# Patient Record
Sex: Female | Born: 1965 | Race: Black or African American | Hispanic: No | Marital: Single | Smoking: Never smoker
Health system: Southern US, Community
[De-identification: ages and names within clinical notes are randomized; demographics above are authoritative.]

## PROBLEM LIST (undated history)

## (undated) DIAGNOSIS — A6000 Herpesviral infection of urogenital system, unspecified: Secondary | ICD-10-CM

## (undated) DIAGNOSIS — I10 Essential (primary) hypertension: Secondary | ICD-10-CM

## (undated) DIAGNOSIS — F419 Anxiety disorder, unspecified: Secondary | ICD-10-CM

## (undated) DIAGNOSIS — E785 Hyperlipidemia, unspecified: Secondary | ICD-10-CM

## (undated) DIAGNOSIS — F411 Generalized anxiety disorder: Secondary | ICD-10-CM

## (undated) DIAGNOSIS — E669 Obesity, unspecified: Secondary | ICD-10-CM

## (undated) DIAGNOSIS — N189 Chronic kidney disease, unspecified: Secondary | ICD-10-CM

## (undated) DIAGNOSIS — E66811 Obesity, class 1: Secondary | ICD-10-CM

## (undated) DIAGNOSIS — N289 Disorder of kidney and ureter, unspecified: Secondary | ICD-10-CM

## (undated) HISTORY — DX: Obesity, class 1: E66.811

## (undated) HISTORY — DX: Herpesviral infection of urogenital system, unspecified: A60.00

## (undated) HISTORY — DX: Generalized anxiety disorder: F41.1

## (undated) HISTORY — DX: Obesity, unspecified: E66.9

## (undated) HISTORY — DX: Hyperlipidemia, unspecified: E78.5

## (undated) HISTORY — DX: Essential (primary) hypertension: I10

## (undated) HISTORY — DX: Chronic kidney disease, unspecified: N18.9

---

## 2020-09-22 DIAGNOSIS — F419 Anxiety disorder, unspecified: Secondary | ICD-10-CM | POA: Diagnosis not present

## 2020-09-28 DIAGNOSIS — F419 Anxiety disorder, unspecified: Secondary | ICD-10-CM | POA: Diagnosis not present

## 2020-10-13 DIAGNOSIS — F419 Anxiety disorder, unspecified: Secondary | ICD-10-CM | POA: Diagnosis not present

## 2020-10-27 DIAGNOSIS — F419 Anxiety disorder, unspecified: Secondary | ICD-10-CM | POA: Diagnosis not present

## 2020-11-10 DIAGNOSIS — F419 Anxiety disorder, unspecified: Secondary | ICD-10-CM | POA: Diagnosis not present

## 2020-12-07 ENCOUNTER — Ambulatory Visit: Payer: Self-pay | Admitting: Adult Health

## 2021-01-09 DIAGNOSIS — A6000 Herpesviral infection of urogenital system, unspecified: Secondary | ICD-10-CM | POA: Diagnosis not present

## 2021-01-31 DIAGNOSIS — D2239 Melanocytic nevi of other parts of face: Secondary | ICD-10-CM | POA: Diagnosis not present

## 2021-01-31 DIAGNOSIS — L918 Other hypertrophic disorders of the skin: Secondary | ICD-10-CM | POA: Diagnosis not present

## 2021-01-31 DIAGNOSIS — L821 Other seborrheic keratosis: Secondary | ICD-10-CM | POA: Diagnosis not present

## 2021-01-31 DIAGNOSIS — L820 Inflamed seborrheic keratosis: Secondary | ICD-10-CM | POA: Diagnosis not present

## 2021-03-27 ENCOUNTER — Other Ambulatory Visit: Payer: Self-pay | Admitting: Physician Assistant

## 2021-07-03 ENCOUNTER — Encounter: Payer: Self-pay | Admitting: Internal Medicine

## 2021-07-03 ENCOUNTER — Ambulatory Visit: Payer: BC Managed Care – PPO | Admitting: Internal Medicine

## 2021-07-03 VITALS — BP 124/84 | HR 76 | Temp 98.3°F | Ht 68.0 in | Wt 221.7 lb

## 2021-07-03 DIAGNOSIS — F411 Generalized anxiety disorder: Secondary | ICD-10-CM | POA: Diagnosis not present

## 2021-07-03 DIAGNOSIS — Z124 Encounter for screening for malignant neoplasm of cervix: Secondary | ICD-10-CM

## 2021-07-03 DIAGNOSIS — E669 Obesity, unspecified: Secondary | ICD-10-CM | POA: Diagnosis not present

## 2021-07-03 DIAGNOSIS — E785 Hyperlipidemia, unspecified: Secondary | ICD-10-CM | POA: Insufficient documentation

## 2021-07-03 DIAGNOSIS — A6000 Herpesviral infection of urogenital system, unspecified: Secondary | ICD-10-CM

## 2021-07-03 DIAGNOSIS — I10 Essential (primary) hypertension: Secondary | ICD-10-CM

## 2021-07-03 DIAGNOSIS — Z1211 Encounter for screening for malignant neoplasm of colon: Secondary | ICD-10-CM

## 2021-07-03 DIAGNOSIS — E782 Mixed hyperlipidemia: Secondary | ICD-10-CM

## 2021-07-03 DIAGNOSIS — Z1231 Encounter for screening mammogram for malignant neoplasm of breast: Secondary | ICD-10-CM

## 2021-07-03 DIAGNOSIS — E66811 Obesity, class 1: Secondary | ICD-10-CM

## 2021-07-03 MED ORDER — HYDROCHLOROTHIAZIDE 25 MG PO TABS: 25.0000 mg | ORAL_TABLET | Freq: Every day | ORAL | 1 refills | Status: DC

## 2021-07-03 MED ORDER — AMLODIPINE BESYLATE 5 MG PO TABS: 5.0000 mg | ORAL_TABLET | Freq: Every day | ORAL | 1 refills | Status: DC

## 2021-07-03 MED ORDER — VALACYCLOVIR HCL 500 MG PO TABS: 500.0000 mg | ORAL_TABLET | Freq: Every day | ORAL | 1 refills | Status: DC

## 2021-07-03 MED ORDER — OLMESARTAN MEDOXOMIL 20 MG PO TABS: 20.0000 mg | ORAL_TABLET | Freq: Every day | ORAL | 1 refills | Status: DC

## 2021-07-03 MED ORDER — BUPROPION HCL ER (XL) 150 MG PO TB24: 150.0000 mg | ORAL_TABLET | Freq: Every day | ORAL | 1 refills | Status: DC

## 2021-07-03 NOTE — Progress Notes (Signed)
? ? ?New Patient Office Visit ? ? ? ? ?This visit occurred during the SARS-CoV-2 public health emergency.  Safety protocols were in place, including screening questions prior to the visit, additional usage of staff PPE, and extensive cleaning of exam room while observing appropriate contact time as indicated for disinfecting solutions.  ? ? ?CC/Reason for Visit: Establish care, discuss chronic conditions, medication refills ?Previous PCP: In [place] ?Last Visit: 2021 ? ?HPI: Rhonda Harrison is a 56 y.o. female who is coming in today for the above mentioned reasons. Past Medical History is significant for: Hypertension, hyperlipidemia, genital herpes on chronic suppressive therapy, generalized anxiety disorder.  She is an Geophysicist/field seismologist principal for middle school.  She moved from [place] 1 year ago to be closer to family.  She is single, she has 2 daughters and 1 grandson.  She does not smoke.  She drinks alcohol occasionally, no known drug allergies, no past surgical history.  Her parents are both deceased, mother at age 18, however as far as she is aware there is no past medical history of significance in her family.  She is overdue for all age-appropriate cancer screenings.  She will bring in copies of her immunization records but she does not believe she has had an updated Tdap or shingles vaccinations. ? ? ?Past Medical/Surgical History: ?Past Medical History:  ?Diagnosis Date  ? GAD (generalized anxiety disorder)   ? Genital HSV   ? Hyperlipidemia   ? Hypertension   ? Obesity (BMI 30.0-34.9)   ? ? ?History reviewed. No pertinent surgical history. ? ?Social History: ? reports that she has never smoked. She has never used smokeless tobacco. She reports that she does not drink alcohol and does not use drugs. ? ?Allergies: ?No Known Allergies ? ?Family History:  ?History reviewed. No pertinent family history. ? ? ?Current Outpatient Medications:  ?  Ascorbic Acid (VITAMIN C) 100 MG tablet, Take 100 mg by mouth  daily., Disp: , Rfl:  ?  diclofenac Sodium (VOLTAREN) 1 % GEL, Apply 2 g topically 4 (four) times daily., Disp: , Rfl:  ?  Ferrous Gluconate (FE-40 PO), Take by mouth., Disp: , Rfl:  ?  ibuprofen (ADVIL) 600 MG tablet, Take by mouth., Disp: , Rfl:  ?  vitamin B-12 (CYANOCOBALAMIN) 500 MCG tablet, Take 500 mcg by mouth daily., Disp: , Rfl:  ?  amLODipine (NORVASC) 5 MG tablet, Take 1 tablet (5 mg total) by mouth daily., Disp: 90 tablet, Rfl: 1 ?  atorvastatin (LIPITOR) 10 MG tablet, Take 10 mg by mouth daily. (Patient not taking: Reported on 07/03/2021), Disp: , Rfl:  ?  buPROPion (WELLBUTRIN XL) 150 MG 24 hr tablet, Take 1 tablet (150 mg total) by mouth daily., Disp: 90 tablet, Rfl: 1 ?  hydrochlorothiazide (HYDRODIURIL) 25 MG tablet, Take 1 tablet (25 mg total) by mouth daily., Disp: 90 tablet, Rfl: 1 ?  olmesartan (BENICAR) 20 MG tablet, Take 1 tablet (20 mg total) by mouth daily., Disp: 90 tablet, Rfl: 1 ?  valACYclovir (VALTREX) 500 MG tablet, Take 1 tablet (500 mg total) by mouth daily., Disp: 90 tablet, Rfl: 1 ? ?Review of Systems:  ?Constitutional: Denies fever, chills, diaphoresis, appetite change and fatigue.  ?HEENT: Denies photophobia, eye pain, redness, hearing loss, ear pain, congestion, sore throat, rhinorrhea, sneezing, mouth sores, trouble swallowing, neck pain, neck stiffness and tinnitus.   ?Respiratory: Denies SOB, DOE, cough, chest tightness,  and wheezing.   ?Cardiovascular: Denies chest pain, palpitations and leg swelling.  ?Gastrointestinal: Denies  nausea, vomiting, abdominal pain, diarrhea, constipation, blood in stool and abdominal distention.  ?Genitourinary: Denies dysuria, urgency, frequency, hematuria, flank pain and difficulty urinating.  ?Endocrine: Denies: hot or cold intolerance, sweats, changes in hair or nails, polyuria, polydipsia. ?Musculoskeletal: Denies myalgias, back pain, joint swelling, arthralgias and gait problem.  ?Skin: Denies pallor, rash and wound.  ?Neurological:  Denies dizziness, seizures, syncope, weakness, light-headedness, numbness and headaches.  ?Hematological: Denies adenopathy. Easy bruising, personal or family bleeding history  ?Psychiatric/Behavioral: Denies suicidal ideation, mood changes, confusion, nervousness, sleep disturbance and agitation ? ? ? ?Physical Exam: ?Vitals:  ? 07/03/21 1517  ?BP: 124/84  ?Pulse: 76  ?Temp: 98.3 ?F (36.8 ?C)  ?TempSrc: Oral  ?SpO2: 98%  ?Weight: 221 lb 11.2 oz (100.6 kg)  ?Height: 5\' 8"  (1.727 m)  ? ?Body mass index is 33.71 kg/m?. ? ?Constitutional: NAD, calm, comfortable ?Eyes: PERRL, lids and conjunctivae normal ?ENMT: Mucous membranes are moist.  ?Respiratory: clear to auscultation bilaterally, no wheezing, no crackles. Normal respiratory effort. No accessory muscle use.  ?Cardiovascular: Regular rate and rhythm, no murmurs / rubs / gallops. No extremity edema.  ?Neurologic: Grossly intact and nonfocal ?Psychiatric: Normal judgment and insight. Alert and oriented x 3. Normal mood.  ? ? ?Impression and Plan: ? ?Primary hypertension ? - Plan: amLODipine (NORVASC) 5 MG tablet, hydrochlorothiazide (HYDRODIURIL) 25 MG tablet, olmesartan (BENICAR) 20 MG tablet ?-Blood pressure is well controlled on these medications, refills have been sent. ? ?Mixed hyperlipidemia ?-Not on medication, check lipids when she returns for CPE. ? ?GAD (generalized anxiety disorder)  ?- Plan: buPROPion (WELLBUTRIN XL) 150 MG 24 hr tablet ?Flowsheet Row Office Visit from 07/03/2021 in [facility] [place]  ?PHQ-9 Total Score 3  ? ?  ? ?Obesity (BMI 30.0-34.9) ?-Discussed healthy lifestyle, including increased physical activity and better food choices to promote weight loss. ? ?Genital herpes simplex, unspecified site  ?- Plan: valACYclovir (VALTREX) 500 MG tablet ? ?Colon cancer screening  ?- Plan: Ambulatory referral to Gastroenterology ? ?Encounter for screening mammogram for malignant neoplasm of breast  ?- Plan: MM Digital  Screening ? ?Cervical cancer screening ? - Plan: Ambulatory referral to Obstetrics / Gynecology ? ?Time spent: 46 minutes reviewing chart, interviewing and examining patient and formulating plan of care. ? ? ? ?Patient Instructions  ?-Nice seeing you today!! ? ?-Refills have been sent. ? ?-Schedule follow up in 3 months for your physical. Come in fasting. ? ? ? ?07-14-1992, MD ?[facility] ? ?

## 2021-07-03 NOTE — Patient Instructions (Signed)
-  Nice seeing you today!! ? ?-Refills have been sent. ? ?-Schedule follow up in 3 months for your physical. Come in fasting. ?

## 2021-07-04 ENCOUNTER — Encounter: Payer: Self-pay | Admitting: Internal Medicine

## 2021-07-10 ENCOUNTER — Ambulatory Visit
Admission: RE | Admit: 2021-07-10 | Discharge: 2021-07-10 | Disposition: A | Discharge status: Home / Self Care | Payer: BC Managed Care – PPO | Source: Ambulatory Visit | Attending: Internal Medicine | Admitting: Internal Medicine

## 2021-07-10 DIAGNOSIS — Z1231 Encounter for screening mammogram for malignant neoplasm of breast: Secondary | ICD-10-CM

## 2021-08-04 ENCOUNTER — Encounter: Payer: Self-pay | Admitting: Internal Medicine

## 2021-08-04 DIAGNOSIS — G4730 Sleep apnea, unspecified: Secondary | ICD-10-CM

## 2021-08-14 ENCOUNTER — Ambulatory Visit: Payer: BC Managed Care – PPO | Admitting: Family Medicine

## 2021-08-14 ENCOUNTER — Ambulatory Visit: Payer: BC Managed Care – PPO | Admitting: Internal Medicine

## 2021-08-14 ENCOUNTER — Other Ambulatory Visit (HOSPITAL_COMMUNITY)
Admission: RE | Admit: 2021-08-14 | Discharge: 2021-08-14 | Disposition: A | Discharge status: Home / Self Care | Payer: BC Managed Care – PPO | Source: Ambulatory Visit | Attending: Family Medicine | Admitting: Family Medicine

## 2021-08-14 ENCOUNTER — Encounter: Payer: Self-pay | Admitting: Family Medicine

## 2021-08-14 ENCOUNTER — Encounter: Payer: Self-pay | Admitting: Internal Medicine

## 2021-08-14 VITALS — BP 116/82 | HR 72 | Ht 68.0 in | Wt 223.0 lb

## 2021-08-14 VITALS — BP 124/90 | HR 70 | Temp 98.2°F | Wt 222.5 lb

## 2021-08-14 DIAGNOSIS — G4733 Obstructive sleep apnea (adult) (pediatric): Secondary | ICD-10-CM | POA: Diagnosis not present

## 2021-08-14 DIAGNOSIS — Z124 Encounter for screening for malignant neoplasm of cervix: Secondary | ICD-10-CM | POA: Insufficient documentation

## 2021-08-14 NOTE — Progress Notes (Signed)
Established Patient Office Visit     CC/Reason for Visit: Discuss acute concerns  HPI: Rhonda Harrison is a 56 y.o. female who is coming in today for the above mentioned reasons.  She has noticed now for a few months that she has been having excessive daytime fatigue.  She does not sleep well at night and has frequent nighttime awakenings.  Sometimes she feels like "my breath is taken away".  Her daughter refuses to sleep in the same room as her due to loud snoring.  Wakes up tired and with a headache.  She has found very concerning that she has started to doze off when she is driving home from work.  Past Medical/Surgical History: Past Medical History:  Diagnosis Date   GAD (generalized anxiety disorder)    Genital HSV    Hyperlipidemia    Hypertension    Obesity (BMI 30.0-34.9)     No past surgical history on file.  Social History:  reports that she has never smoked. She has never used smokeless tobacco. She reports that she does not drink alcohol and does not use drugs.  Allergies: No Known Allergies  Family History:  No history of heart attack, stroke, cancer that she is aware of   Current Outpatient Medications:    amLODipine (NORVASC) 5 MG tablet, Take 1 tablet (5 mg total) by mouth daily., Disp: 90 tablet, Rfl: 1   Ascorbic Acid (VITAMIN C) 100 MG tablet, Take 100 mg by mouth daily., Disp: , Rfl:    atorvastatin (LIPITOR) 10 MG tablet, Take 10 mg by mouth daily., Disp: , Rfl:    buPROPion (WELLBUTRIN XL) 150 MG 24 hr tablet, Take 1 tablet (150 mg total) by mouth daily., Disp: 90 tablet, Rfl: 1   diclofenac Sodium (VOLTAREN) 1 % GEL, Apply 2 g topically 4 (four) times daily., Disp: , Rfl:    Ferrous Gluconate (FE-40 PO), Take by mouth., Disp: , Rfl:    hydrochlorothiazide (HYDRODIURIL) 25 MG tablet, Take 1 tablet (25 mg total) by mouth daily., Disp: 90 tablet, Rfl: 1   ibuprofen (ADVIL) 600 MG tablet, Take by mouth., Disp: , Rfl:    olmesartan (BENICAR) 20 MG  tablet, Take 1 tablet (20 mg total) by mouth daily., Disp: 90 tablet, Rfl: 1   valACYclovir (VALTREX) 500 MG tablet, Take 1 tablet (500 mg total) by mouth daily., Disp: 90 tablet, Rfl: 1   vitamin B-12 (CYANOCOBALAMIN) 500 MCG tablet, Take 500 mcg by mouth daily., Disp: , Rfl:   Review of Systems:  Constitutional: Denies fever, chills, diaphoresis, appetite change. HEENT: Denies photophobia, eye pain, redness, hearing loss, ear pain, congestion, sore throat, rhinorrhea, sneezing, mouth sores, trouble swallowing, neck pain, neck stiffness and tinnitus.   Respiratory: Denies SOB, DOE, cough, chest tightness,  and wheezing.   Cardiovascular: Denies chest pain, palpitations and leg swelling.  Gastrointestinal: Denies nausea, vomiting, abdominal pain, diarrhea, constipation, blood in stool and abdominal distention.  Genitourinary: Denies dysuria, urgency, frequency, hematuria, flank pain and difficulty urinating.  Endocrine: Denies: hot or cold intolerance, sweats, changes in hair or nails, polyuria, polydipsia. Musculoskeletal: Denies myalgias, back pain, joint swelling, arthralgias and gait problem.  Skin: Denies pallor, rash and wound.  Neurological: Denies dizziness, seizures, syncope, weakness, light-headedness, numbness. Hematological: Denies adenopathy. Easy bruising, personal or family bleeding history  Psychiatric/Behavioral: Denies suicidal ideation, mood changes, confusion, nervousness and agitation    Physical Exam: Vitals:   08/14/21 1028  BP: 124/90  Pulse: 70  Temp: 98.2 F (  36.8 C)  TempSrc: Oral  SpO2: 98%  Weight: 222 lb 8 oz (100.9 kg)    Body mass index is 33.83 kg/m.   Constitutional: NAD, calm, comfortable Eyes: PERRL, lids and conjunctivae normal, wears corrective lenses ENMT: Mucous membranes are moist.  Respiratory: clear to auscultation bilaterally, no wheezing, no crackles. Normal respiratory effort. No accessory muscle use.  Cardiovascular: Regular rate  and rhythm, no murmurs / rubs / gallops. No extremity edema.  Psychiatric: Normal judgment and insight. Alert and oriented x 3. Normal mood.    Impression and Plan:  OSA (obstructive sleep apnea)  - Plan: Ambulatory referral to Neurology -I suspect she has probable obstructive sleep apnea, will refer to neurology for sleep study and evaluation.    Time spent:30 minutes reviewing chart, interviewing and examining patient and formulating plan of care.    Chaya Jan, MD [facility]

## 2021-08-14 NOTE — Progress Notes (Signed)
New GYN patient presents for Annual Exam.  LMP: Post Menopausal  Last Mammogram:07/10/2021 Family Hx of Breast Cancer: None Family Hx of Ovarian Cancer: None Last Pap: > 2 yrs patient wants pap today No Hx of Abnormal paps  Hx of Fibroids had procedure done to shrink fibroids in 2010  Hx of UTI's no sx's today.  CC: None

## 2021-08-14 NOTE — Progress Notes (Signed)
   GYNECOLOGY ANNUAL PREVENTATIVE CARE ENCOUNTER NOTE  Subjective:   Rhonda Harrison is a 56 y.o. [phone] female here for a routine annual gynecologic exam.  Current complaints: wants pap  No history of abnormal paps Had mammogram in early May -- WNL No breast concerns today Has gone through menopause, no concerns today.   Denies abnormal vaginal bleeding, discharge, pelvic pain, problems with intercourse or other gynecologic concerns.    Gynecologic History No LMP recorded. Patient is postmenopausal. Contraception: post menopausal status Last Pap: 2 years ago . Results were: normal Last mammogram: May 2023. Results were: normal  Health Maintenance Due  Topic Date Due   HIV Screening  Never done   Hepatitis C Screening  Never done    The following portions of the patient's history were reviewed and updated as appropriate: allergies, current medications, past family history, past medical history, past social history, past surgical history and problem list.  Review of Systems Pertinent items are noted in HPI.   Objective:  BP 116/82   Pulse 72   Ht 5\' 8"  (1.727 m)   Wt 223 lb (101.2 kg)   BMI 33.91 kg/m  CONSTITUTIONAL: Well-developed, well-nourished female in no acute distress.  HENT:  Normocephalic, atraumatic, External right and left ear normal. Oropharynx is clear and moist EYES:  No scleral icterus.  NECK: Normal range of motion, supple, no masses.  Normal thyroid.  SKIN: Skin is warm and dry. No rash noted. Not diaphoretic. No erythema. No pallor. NEUROLOGIC: Alert and oriented to person, place, and time. Normal reflexes, muscle tone coordination. No cranial nerve deficit noted. PSYCHIATRIC: Normal mood and affect. Normal behavior. Normal judgment and thought content. CARDIOVASCULAR: Normal heart rate noted, regular rhythm. 2+ distal pulses. RESPIRATORY: Effort and breath sounds normal, no problems with respiration noted. BREASTS: Symmetric in size. No masses, skin  changes, nipple drainage, or lymphadenopathy. ABDOMEN: Soft,  no distention noted.  No tenderness, rebound or guarding.  PELVIC: Normal appearing external genitalia; Mildly atrophic vaginal mucosa and cervix. No abnormal discharge noted.  Pap smear obtained.  Normal uterine size, no other palpable masses, no uterine or adnexal tenderness. MUSCULOSKELETAL: Normal range of motion.    Assessment and Plan:  1) Annual gynecologic examination with pap smear:  Will follow up results of pap smear and manage accordingly.   Routine preventative health maintenance measures emphasized. Reviewed perimenopausal symptoms and management.    1. Screening for cervical cancer - Cytology - PAP   Please refer to After Visit Summary for other counseling recommendations.   No follow-ups on file.  , MD, MPH, ABFM [facility]ending [facility] for [facility]

## 2021-08-16 LAB — CYTOLOGY - PAP
Comment: NEGATIVE
Diagnosis: NEGATIVE
High risk HPV: NEGATIVE

## 2021-08-17 ENCOUNTER — Telehealth: Payer: Self-pay

## 2021-08-17 NOTE — Telephone Encounter (Signed)
Pt aware of pap results.

## 2021-09-26 ENCOUNTER — Encounter: Payer: Self-pay | Admitting: Internal Medicine

## 2021-10-02 ENCOUNTER — Encounter: Payer: Self-pay | Admitting: Internal Medicine

## 2021-10-02 ENCOUNTER — Other Ambulatory Visit: Payer: Self-pay | Admitting: Internal Medicine

## 2021-10-02 ENCOUNTER — Ambulatory Visit (INDEPENDENT_AMBULATORY_CARE_PROVIDER_SITE_OTHER): Payer: BC Managed Care – PPO | Admitting: Internal Medicine

## 2021-10-02 VITALS — BP 124/84 | HR 67 | Temp 97.7°F | Ht 68.5 in | Wt 230.3 lb

## 2021-10-02 DIAGNOSIS — I10 Essential (primary) hypertension: Secondary | ICD-10-CM | POA: Diagnosis not present

## 2021-10-02 DIAGNOSIS — H539 Unspecified visual disturbance: Secondary | ICD-10-CM

## 2021-10-02 DIAGNOSIS — D649 Anemia, unspecified: Secondary | ICD-10-CM | POA: Insufficient documentation

## 2021-10-02 DIAGNOSIS — E559 Vitamin D deficiency, unspecified: Secondary | ICD-10-CM | POA: Insufficient documentation

## 2021-10-02 DIAGNOSIS — Z23 Encounter for immunization: Secondary | ICD-10-CM | POA: Diagnosis not present

## 2021-10-02 DIAGNOSIS — R7302 Impaired glucose tolerance (oral): Secondary | ICD-10-CM | POA: Insufficient documentation

## 2021-10-02 DIAGNOSIS — Z0000 Encounter for general adult medical examination without abnormal findings: Secondary | ICD-10-CM | POA: Diagnosis not present

## 2021-10-02 DIAGNOSIS — E782 Mixed hyperlipidemia: Secondary | ICD-10-CM

## 2021-10-02 DIAGNOSIS — E669 Obesity, unspecified: Secondary | ICD-10-CM | POA: Diagnosis not present

## 2021-10-02 DIAGNOSIS — N183 Chronic kidney disease, stage 3 unspecified: Secondary | ICD-10-CM | POA: Insufficient documentation

## 2021-10-02 DIAGNOSIS — F411 Generalized anxiety disorder: Secondary | ICD-10-CM

## 2021-10-02 LAB — HEMOGLOBIN A1C: Hgb A1c MFr Bld: 6.4 % (ref 4.6–6.5)

## 2021-10-02 LAB — CBC WITH DIFFERENTIAL/PLATELET
Basophils Absolute: 0.1 10*3/uL (ref 0.0–0.1)
Basophils Relative: 0.6 % (ref 0.0–3.0)
Eosinophils Absolute: 0.3 10*3/uL (ref 0.0–0.7)
Eosinophils Relative: 3.1 % (ref 0.0–5.0)
HCT: 33.6 % — ABNORMAL LOW (ref 36.0–46.0)
Hemoglobin: 10.9 g/dL — ABNORMAL LOW (ref 12.0–15.0)
Lymphocytes Relative: 28.4 % (ref 12.0–46.0)
Lymphs Abs: 2.7 10*3/uL (ref 0.7–4.0)
MCHC: 32.5 g/dL (ref 30.0–36.0)
MCV: 82.3 fl (ref 78.0–100.0)
Monocytes Absolute: 0.8 10*3/uL (ref 0.1–1.0)
Monocytes Relative: 8.8 % (ref 3.0–12.0)
Neutro Abs: 5.6 10*3/uL (ref 1.4–7.7)
Neutrophils Relative %: 59.1 % (ref 43.0–77.0)
Platelets: 323 10*3/uL (ref 150.0–400.0)
RBC: 4.08 Mil/uL (ref 3.87–5.11)
RDW: 14 % (ref 11.5–15.5)
WBC: 9.4 10*3/uL (ref 4.0–10.5)

## 2021-10-02 LAB — LIPID PANEL
Cholesterol: 196 mg/dL (ref 0–200)
HDL: 48.6 mg/dL (ref >39.00)
LDL Cholesterol: 135 mg/dL — ABNORMAL HIGH (ref 0–99)
NonHDL: 147.51
Total CHOL/HDL Ratio: 4
Triglycerides: 61 mg/dL (ref 0.0–149.0)
VLDL: 12.2 mg/dL (ref 0.0–40.0)

## 2021-10-02 LAB — COMPREHENSIVE METABOLIC PANEL
ALT: 13 U/L (ref 0–35)
AST: 13 U/L (ref 0–37)
Albumin: 4.2 g/dL (ref 3.5–5.2)
Alkaline Phosphatase: 62 U/L (ref 39–117)
BUN: 15 mg/dL (ref 6–23)
CO2: 27 mEq/L (ref 19–32)
Calcium: 9.5 mg/dL (ref 8.4–10.5)
Chloride: 102 mEq/L (ref 96–112)
Creatinine, Ser: 1.11 mg/dL (ref 0.40–1.20)
GFR: 56.28 mL/min — ABNORMAL LOW (ref >60.00)
Glucose, Bld: 105 mg/dL — ABNORMAL HIGH (ref 70–99)
Potassium: 4 mEq/L (ref 3.5–5.1)
Sodium: 138 mEq/L (ref 135–145)
Total Bilirubin: 0.4 mg/dL (ref 0.2–1.2)
Total Protein: 7.6 g/dL (ref 6.0–8.3)

## 2021-10-02 LAB — TSH: TSH: 4.68 u[IU]/mL (ref 0.35–5.50)

## 2021-10-02 LAB — VITAMIN D 25 HYDROXY (VIT D DEFICIENCY, FRACTURES): VITD: 26.44 ng/mL — ABNORMAL LOW (ref 30.00–100.00)

## 2021-10-02 LAB — VITAMIN B12: Vitamin B-12: 412 pg/mL (ref 211–911)

## 2021-10-02 MED ORDER — VITAMIN D (ERGOCALCIFEROL) 1.25 MG (50000 UNIT) PO CAPS: 50000.0000 [IU] | ORAL_CAPSULE | ORAL | 0 refills | Status: AC

## 2021-10-02 NOTE — Progress Notes (Signed)
1. Vit D def: 93903 IU weekly x 12 weeks with follow up levels then. Will send Rx.  2. Cholesterol is high. In addition to Lifestyle modifications: healthy eating, weight loss, increased physical activity, would advise she increase lipitor to 20 mg daily. If she agrees, please send Rx.  3. She would appear to have CKD Stage 3. Any prior labs she has on hand that we cold compare to? In any case, nothing to do but observation at this point. This is unlikely to cause any long term issues, but is something we need to be aware of and follow.  4. She is anemic. Could be contributing to fatigue. Will add iron studies. Can we see if lab can add to previously drawn sample? Give her h/o colon polyps, make sure she is aware to schedule ASAP with GI.  5. Her A1c is 6.4. This makes a prediabetic, very close to becoming a diabetic. As above lifestyle changes are imperative. Please have her f/u in 3 months as opposed to the 6 months we had discussed during her visit.  Rest of labs look ok.  Offer OV if she would like to discuss these labs, as I understand this is a lot of new information for her :)

## 2021-10-02 NOTE — Patient Instructions (Signed)
-  Nice seeing you today!!  -Lab work today; will notify you once results are available.  -First shingles vaccine today.  -Remember COVID vaccine at the pharmacy.  -Schedule follow up in 6 months.

## 2021-10-02 NOTE — Progress Notes (Signed)
Established Patient Office Visit     CC/Reason for Visit: Annual preventive exam  HPI: Rhonda Harrison is a 56 y.o. female who is coming in today for the above mentioned reasons. Past Medical History is significant for: Hypertension, hyperlipidemia, generalized anxiety disorder, genital HSV.  She is scheduled for consultation with neurology on August 3 due to a high likelihood of obstructive sleep apnea.  She is due for shingles and bivalent COVID-vaccine.  She is also due for colonoscopy.  She is overdue for an ophthalmology exam, she has dental care.  She has recently had a Pap smear and mammogram.   Past Medical/Surgical History: Past Medical History:  Diagnosis Date   GAD (generalized anxiety disorder)    Genital HSV    Hyperlipidemia    Hypertension    Obesity (BMI 30.0-34.9)     No past surgical history on file.  Social History:  reports that she has never smoked. She has never used smokeless tobacco. She reports current alcohol use. She reports that she does not use drugs.  Allergies: No Known Allergies  Family History:  No history of heart disease, cancer, stroke that she is aware of.   Current Outpatient Medications:    amLODipine (NORVASC) 5 MG tablet, Take 1 tablet (5 mg total) by mouth daily., Disp: 90 tablet, Rfl: 1   Ascorbic Acid (VITAMIN C) 100 MG tablet, Take 100 mg by mouth daily., Disp: , Rfl:    atorvastatin (LIPITOR) 10 MG tablet, Take 10 mg by mouth daily., Disp: , Rfl:    buPROPion (WELLBUTRIN XL) 150 MG 24 hr tablet, Take 1 tablet (150 mg total) by mouth daily., Disp: 90 tablet, Rfl: 1   Ferrous Gluconate (FE-40 PO), Take by mouth., Disp: , Rfl:    hydrochlorothiazide (HYDRODIURIL) 25 MG tablet, Take 1 tablet (25 mg total) by mouth daily., Disp: 90 tablet, Rfl: 1   olmesartan (BENICAR) 20 MG tablet, Take 1 tablet (20 mg total) by mouth daily., Disp: 90 tablet, Rfl: 1   valACYclovir (VALTREX) 500 MG tablet, Take 1 tablet (500 mg total) by mouth  daily., Disp: 90 tablet, Rfl: 1   vitamin B-12 (CYANOCOBALAMIN) 500 MCG tablet, Take 500 mcg by mouth daily., Disp: , Rfl:   Review of Systems:  Constitutional: Denies fever, chills, diaphoresis, appetite change and fatigue.  HEENT: Denies photophobia, eye pain, redness, hearing loss, ear pain, congestion, sore throat, rhinorrhea, sneezing, mouth sores, trouble swallowing, neck pain, neck stiffness and tinnitus.   Respiratory: Denies SOB, DOE, cough, chest tightness,  and wheezing.   Cardiovascular: Denies chest pain, palpitations and leg swelling.  Gastrointestinal: Denies nausea, vomiting, abdominal pain, diarrhea, constipation, blood in stool and abdominal distention.  Genitourinary: Denies dysuria, urgency, frequency, hematuria, flank pain and difficulty urinating.  Endocrine: Denies: hot or cold intolerance, sweats, changes in hair or nails, polyuria, polydipsia. Musculoskeletal: Denies myalgias, back pain, joint swelling, arthralgias and gait problem.  Skin: Denies pallor, rash and wound.  Neurological: Denies dizziness, seizures, syncope, weakness, light-headedness, numbness and headaches.  Hematological: Denies adenopathy. Easy bruising, personal or family bleeding history  Psychiatric/Behavioral: Denies suicidal ideation, mood changes, confusion, nervousness, sleep disturbance and agitation    Physical Exam: Vitals:   10/02/21 0717  BP: 124/84  Pulse: 67  Temp: 97.7 F (36.5 C)  TempSrc: Oral  SpO2: 99%  Weight: 230 lb 4.8 oz (104.5 kg)  Height: 5' 8.5" (1.74 m)    Body mass index is 34.51 kg/m.   Constitutional: NAD, calm, comfortable Eyes:  PERRL, lids and conjunctivae normal, wears corrective lenses ENMT: Mucous membranes are moist. Posterior pharynx clear of any exudate or lesions. Normal dentition. Tympanic membrane is pearly white, no erythema or bulging. Neck: normal, supple, no masses, no thyromegaly Respiratory: clear to auscultation bilaterally, no wheezing,  no crackles. Normal respiratory effort. No accessory muscle use.  Cardiovascular: Regular rate and rhythm, no murmurs / rubs / gallops. No extremity edema. 2+ pedal pulses. No carotid bruits.  Abdomen: no tenderness, no masses palpated. No hepatosplenomegaly. Bowel sounds positive.  Musculoskeletal: no clubbing / cyanosis. No joint deformity upper and lower extremities. Good ROM, no contractures. Normal muscle tone.  Skin: no rashes, lesions, ulcers. No induration Neurologic: CN 2-12 grossly intact. Sensation intact, DTR normal. Strength 5/5 in all 4.  Psychiatric: Normal judgment and insight. Alert and oriented x 3. Normal mood.    Impression and Plan:  Encounter for preventive health examination -Recommend routine eye and dental care. -Immunizations: First shingles vaccine administered in office today, she will get bivalent COVID-vaccine at pharmacy. -Healthy lifestyle discussed in detail. -Labs to be updated today. -Colon cancer screening: 2018, 5-year follow-up due to polyps, GI referral has been placed -Breast cancer screening: 07/2021 -Cervical cancer screening: 08/2021 -Lung cancer screening: Not applicable -Prostate cancer screening: Not applicable -DEXA: Not applicable  Primary hypertension  - Plan: CBC with Differential/Platelet, Comprehensive metabolic panel, Comprehensive metabolic panel, CBC with Differential/Platelet -Blood pressure is well controlled on amlodipine and olmesartan.  Mixed hyperlipidemia  - Plan: Lipid panel, Lipid panel -Currently on atorvastatin 10 mg daily.  Obesity (BMI 30.0-34.9) - Plan: Hemoglobin A1c, TSH, Vitamin B12, VITAMIN D 25 Hydroxy (Vit-D Deficiency, Fractures), VITAMIN D 25 Hydroxy (Vit-D Deficiency, Fractures), Vitamin B12, TSH, Hemoglobin A1c  GAD (generalized anxiety disorder) Flowsheet Row Office Visit from 10/02/2021 in [facility] [place]  PHQ-9 Total Score 5      -Mood is stable on bupropion.  Need for shingles  vaccine  - Plan: Zoster Recombinant (Shingrix )  Vision changes  - Plan: Ambulatory referral to Ophthalmology     Patient Instructions  -Nice seeing you today!!  -Lab work today; will notify you once results are available.  -First shingles vaccine today.  -Remember COVID vaccine at the pharmacy.  -Schedule follow up in 6 months.      Chaya Jan, MD [facility]

## 2021-10-03 ENCOUNTER — Other Ambulatory Visit (INDEPENDENT_AMBULATORY_CARE_PROVIDER_SITE_OTHER): Payer: BC Managed Care – PPO

## 2021-10-03 ENCOUNTER — Other Ambulatory Visit: Payer: Self-pay | Admitting: *Deleted

## 2021-10-03 DIAGNOSIS — D649 Anemia, unspecified: Secondary | ICD-10-CM | POA: Diagnosis not present

## 2021-10-03 LAB — IBC + FERRITIN
Ferritin: 69 ng/mL (ref 10.0–291.0)
Iron: 41 ug/dL — ABNORMAL LOW (ref 42–145)
Saturation Ratios: 11.5 % — ABNORMAL LOW (ref 20.0–50.0)
TIBC: 357 ug/dL (ref 250.0–450.0)
Transferrin: 255 mg/dL (ref 212.0–360.0)

## 2021-10-03 MED ORDER — ATORVASTATIN CALCIUM 20 MG PO TABS: 20.0000 mg | ORAL_TABLET | Freq: Every day | ORAL | 1 refills | Status: DC

## 2021-10-05 ENCOUNTER — Ambulatory Visit: Payer: BC Managed Care – PPO | Admitting: Internal Medicine

## 2021-10-11 ENCOUNTER — Ambulatory Visit (INDEPENDENT_AMBULATORY_CARE_PROVIDER_SITE_OTHER): Payer: BC Managed Care – PPO | Admitting: Neurology

## 2021-10-11 ENCOUNTER — Encounter: Payer: Self-pay | Admitting: Neurology

## 2021-10-11 VITALS — BP 106/68 | HR 87 | Ht 68.0 in | Wt 222.0 lb

## 2021-10-11 DIAGNOSIS — R519 Headache, unspecified: Secondary | ICD-10-CM

## 2021-10-11 DIAGNOSIS — G4719 Other hypersomnia: Secondary | ICD-10-CM | POA: Diagnosis not present

## 2021-10-11 DIAGNOSIS — R0683 Snoring: Secondary | ICD-10-CM

## 2021-10-11 DIAGNOSIS — G4730 Sleep apnea, unspecified: Secondary | ICD-10-CM | POA: Diagnosis not present

## 2021-10-11 DIAGNOSIS — E669 Obesity, unspecified: Secondary | ICD-10-CM | POA: Diagnosis not present

## 2021-10-11 DIAGNOSIS — Z9189 Other specified personal risk factors, not elsewhere classified: Secondary | ICD-10-CM

## 2021-10-11 DIAGNOSIS — R351 Nocturia: Secondary | ICD-10-CM

## 2021-10-11 NOTE — Patient Instructions (Signed)

## 2021-10-11 NOTE — Progress Notes (Signed)
Subjective:    Patient ID: Rhonda Harrison is a 56 y.o. female.  HPI    Huston Foley, MD, PhD [facility] [address] [address] [place], [place] [address]  Dear Dr. Philip Aspen,  I saw your patient, Rhonda Harrison, upon your kind request, in my sleep clinic today for initial consultation of her sleep disorder, in particular, concern for underlying obstructive sleep apnea.  The patient is unaccompanied today.  As you know, [name] is a 56 year old right-handed woman with an underlying medical history of hypertension, hyperlipidemia, anxiety, vitamin D deficiency, and obesity, who reports snoring and excessive daytime somnolence.  I reviewed your office note from 08/14/2021.  Her Epworth sleepiness score is 7 out of 24, fatigue severity score is 41 out of 63.  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  She has an older daughter, age 39 with 1 child, [place].  Patient is a Geophysicist/field seismologist principal for school.  She goes to bed between 9:30 PM and 10 and rise time is generally between 4:30 AM and 5 AM.  She has a 45-minute commute.  She does admit to having worsening daytime somnolence over the past few months.  She has felt drowsy at the wheel especially in the evenings.  She has recently been started on prescription vitamin D for low vitamin D and has noticed some improvement.  She has been diagnosed with chronic kidney disease.  She had recent blood work through your office and I reviewed the results from 10/02/2021.  Vitamin B12 was 412, vitamin D was 26.44, TSH normal at 4.68, lipid panel showed LDL elevated at 135, otherwise benign findings, A1c was in the prediabetes range at 6.4.  She drinks caffeine in the form of soda, about 2 to 3/day, occasional tea.  She drinks alcohol on special occasions.  She is a non-smoker.  She  does have a TV in her bedroom and has it on at night at times and admits that she sleeps better with the TV off. No pets in the household.  She has nocturia about 2-3 times per night, has had recurrent morning headaches and takes Excedrin about twice a week, previously even more than that.  Her snoring is noted by her family.  She has once woken up with a sense of gasping for air and coughing at night.  Her Past Medical History Is Significant For: Past Medical History:  Diagnosis Date   GAD (generalized anxiety disorder)    Genital HSV    Hyperlipidemia    Hypertension    Obesity (BMI 30.0-34.9)     Her Past Surgical History Is Significant For: History reviewed. No pertinent surgical history.  Her Family History Is Significant For: History reviewed. No pertinent family history.  Her Social History Is Significant For: Social History   Socioeconomic History   Marital status: Single    Spouse name: Not on file   Number of children: Not on file   Years of education: Not on file   Highest education level: Not on file  Occupational History   Not on file  Tobacco Use   Smoking status: Never   Smokeless tobacco: Never  Vaping Use   Vaping Use: Never used  Substance and Sexual Activity   Alcohol use: Yes    Comment: socially   Drug use: Never  Sexual activity: Not Currently    Partners: Male    Birth control/protection: Post-menopausal  Other Topics Concern   Not on file  Social History Narrative   Right handed    Social Determinants of Health   Financial Resource Strain: Not on file  Food Insecurity: Not on file  Transportation Needs: Not on file  Physical Activity: Not on file  Stress: Not on file  Social Connections: Not on file    Her Allergies Are:  No Known Allergies:   Her Current Medications Are:  Outpatient Encounter Medications as of 10/11/2021  Medication Sig   amLODipine (NORVASC) 5 MG tablet Take 1 tablet (5 mg total) by mouth daily.   Ascorbic Acid  (VITAMIN C) 100 MG tablet Take 100 mg by mouth daily.   atorvastatin (LIPITOR) 10 MG tablet Take 10 mg by mouth daily.   atorvastatin (LIPITOR) 20 MG tablet Take 1 tablet (20 mg total) by mouth daily.   buPROPion (WELLBUTRIN XL) 150 MG 24 hr tablet Take 1 tablet (150 mg total) by mouth daily.   Ferrous Gluconate (FE-40 PO) Take by mouth.   hydrochlorothiazide (HYDRODIURIL) 25 MG tablet Take 1 tablet (25 mg total) by mouth daily.   olmesartan (BENICAR) 20 MG tablet Take 1 tablet (20 mg total) by mouth daily.   valACYclovir (VALTREX) 500 MG tablet Take 1 tablet (500 mg total) by mouth daily.   vitamin B-12 (CYANOCOBALAMIN) 500 MCG tablet Take 500 mcg by mouth daily.   Vitamin D, Ergocalciferol, (DRISDOL) 1.25 MG (50000 UNIT) CAPS capsule Take 1 capsule (50,000 Units total) by mouth every 7 (seven) days for 12 doses.   No facility-administered encounter medications on file as of 10/11/2021.  :   Review of Systems:  Out of a complete 14 point review of systems, all are reviewed and negative with the exception of these symptoms as listed below:  Review of Systems  Neurological:        Here for sleep consult. No prior sleep study. Pt reports she has trouble dozing off during work commute(sx ongoing since April or May of this year), snoring is present, along with daytime fatigue.     Objective:  Neurological Exam  Physical Exam Physical Examination:   Vitals:   10/11/21 1512  BP: 106/68  Pulse: 87    General Examination: The patient is a very pleasant 56 y.o. female in no acute distress. She appears well-developed and well-nourished and well groomed.   HEENT: Normocephalic, atraumatic, pupils are equal, round and reactive to light, extraocular tracking is good without limitation to gaze excursion or nystagmus noted. Hearing is grossly intact. Face is symmetric with normal facial animation. Speech is clear with no dysarthria noted. There is no hypophonia. There is no lip, neck/head, jaw or  voice tremor. Neck is supple with full range of passive and active motion. There are no carotid bruits on auscultation. Oropharynx exam reveals: mild mouth dryness, adequate dental hygiene and moderate airway crowding secondary to redundant soft palate, tip of uvula and tonsils not fully visualized, Mallampati class IV.  Neck circumference of 14 three-quarter inches.  She has a minimal overbite.  Tongue protrudes centrally and palate elevates symmetrically.    Chest: Clear to auscultation without wheezing, rhonchi or crackles noted.  Heart: S1+S2+0, regular and normal without murmurs, rubs or gallops noted.   Abdomen: Soft, non-tender and non-distended.  Extremities: There is no pitting edema in the distal lower extremities bilaterally.   Skin: Warm and dry without trophic changes noted.  Musculoskeletal: exam reveals no obvious joint deformities.   Neurologically:  Mental status: The patient is awake, alert and oriented in all 4 spheres. Her immediate and remote memory, attention, language skills and fund of knowledge are appropriate. There is no evidence of aphasia, agnosia, apraxia or anomia. Speech is clear with normal prosody and enunciation. Thought process is linear. Mood is normal and affect is normal.  Cranial nerves II - XII are as described above under HEENT exam.  Motor exam: Normal bulk, strength and tone is noted. There is no obvious tremor. Fine motor skills and coordination: grossly intact.  Cerebellar testing: No dysmetria or intention tremor. There is no truncal or gait ataxia.  Sensory exam: intact to light touch in the upper and lower extremities.  Gait, station and balance: She stands easily. No veering to one side is noted. No leaning to one side is noted. Posture is age-appropriate and stance is narrow based. Gait shows normal stride length and normal pace. No problems turning are noted.   Assessment and plan:  In summary, Tanganika Barradas is a very pleasant 56  y.o.-year old female with an underlying medical history of hypertension, hyperlipidemia, anxiety, vitamin D deficiency, and obesity, whose history and physical exam concerning for sleep disordered breathing, supporting a current working diagnosis of unspecified sleep apnea, with the main differential diagnoses of obstructive sleep apnea (OSA) versus upper airway resistance syndrome (UARS) versus central sleep apnea (CSA), or mixed sleep apnea. A laboratory attended sleep study is considered gold standard for evaluation of sleep disordered breathing and is recommended at this time and clinically justified.   I had a long chat with the patient about my findings and the diagnosis of sleep apnea, particularly OSA, its prognosis and treatment options. We talked about medical/conservative treatments, surgical interventions and non-pharmacological approaches for symptom control. I explained, in particular, the risks and ramifications of untreated moderate to severe OSA, especially with respect to developing cardiovascular disease down the road, including congestive heart failure (CHF), difficult to treat hypertension, cardiac arrhythmias (particularly A-fib), neurovascular complications including TIA, stroke and dementia. Even type 2 diabetes has, in part, been linked to untreated OSA. Symptoms of untreated OSA may include (but may not be limited to) daytime sleepiness, nocturia (i.e. frequent nighttime urination), memory problems, mood irritability and suboptimally controlled or worsening mood disorder such as depression and/or anxiety, lack of energy, lack of motivation, physical discomfort, as well as recurrent headaches, especially morning or nocturnal headaches. We talked about the importance of maintaining a healthy lifestyle and striving for healthy weight. In addition, we talked about the importance of striving for and maintaining good sleep hygiene. I recommended the following at this time: sleep study.  I  outlined the differences between a laboratory attended sleep study which is considered more comprehensive and accurate over the option of a home sleep test (HST); the latter may lead to underestimation of sleep disordered breathing in some instances and does not help with diagnosing upper airway resistance syndrome and is not accurate enough to diagnose primary central sleep apnea typically. I explained the different sleep test procedures to the patient in detail and also outlined possible surgical and non-surgical treatment options of OSA, including the use of a pressure airway pressure (PAP) device (ie CPAP, AutoPAP/APAP or BiPAP in certain circumstances), a custom-made dental device (aka oral appliance, which would require a referral to a specialist dentist or orthodontist typically, and is generally speaking not considered a good choice for patients with full dentures or edentulous  state), upper airway surgical options, such as traditional UPPP (which is not considered a first-line treatment) or the Inspire device (hypoglossal nerve stimulator, which would involve a referral for consultation with an ENT surgeon, after careful selection, following inclusion criteria). I explained the PAP treatment option to the patient in detail, as this is generally considered first-line treatment.  The patient indicated that she would be willing to try PAP therapy, if the need arises. I explained the importance of being compliant with PAP treatment, not only for insurance purposes but primarily to improve patient's symptoms symptoms, and for the patient's long term health benefit, including to reduce Her cardiovascular risks longer-term.    We will pick up our discussion about the next steps and treatment options after testing.  We will keep her posted as to the test results by phone call and/or MyChart messaging where possible.  We will plan to follow-up in sleep clinic accordingly as well.  I answered all her questions  today and the patient was in agreement.   I encouraged her to call with any interim questions, concerns, problems or updates or email [place] through MyChart.  Generally speaking, sleep test authorizations may take up to 2 weeks, sometimes less, sometimes longer, the patient is encouraged to get in touch with [place] if they do not hear back from the sleep lab staff directly within the next 2 weeks.  Thank you very much for allowing me to participate in the care of this nice patient. If I can be of any further assistance to you please do not hesitate to call me at [phone].  Sincerely,   Huston Foley, MD, PhD

## 2021-10-18 ENCOUNTER — Encounter: Payer: Self-pay | Admitting: Internal Medicine

## 2021-10-29 ENCOUNTER — Encounter: Payer: Self-pay | Admitting: Internal Medicine

## 2021-10-29 DIAGNOSIS — N183 Chronic kidney disease, stage 3 unspecified: Secondary | ICD-10-CM

## 2021-10-30 ENCOUNTER — Telehealth: Payer: Self-pay | Admitting: Internal Medicine

## 2021-10-30 NOTE — Telephone Encounter (Signed)
Patient dropped off paperwork Friday before closing. Paperwork is for [employer]. Placed in folder to be completed.     Please advise

## 2021-10-31 NOTE — Telephone Encounter (Signed)
Patient is requesting Intermittent FLMA for doctor's appointments.  She is asking for 1 day a month.  She has a colonoscopy, sleep study, dietician appointments etc.  She works in [place] and her doctors are in [place].  She states that she has "stage 3 kidney disease and is going to all the doctors that Dr Ardyth Harps has recommended".

## 2021-11-01 NOTE — Telephone Encounter (Signed)
Patient states that would be great.  3-6 months if possible.

## 2021-11-02 ENCOUNTER — Telehealth: Payer: Self-pay | Admitting: Neurology

## 2021-11-02 NOTE — Telephone Encounter (Signed)
HST- [employer] [place]: [id number] (exp. 10/23/21 to 12/21/21).  Patient is scheduled at [facility] for 11/28/21 at 2:30 PM.  Mailed packet to the patient.

## 2021-11-02 NOTE — Telephone Encounter (Signed)
Form complete and ready for pick up.  Patient is  aware.

## 2021-11-09 ENCOUNTER — Ambulatory Visit: Payer: BC Managed Care – PPO | Admitting: Internal Medicine

## 2021-11-09 VITALS — BP 102/72 | HR 85 | Temp 98.5°F | Wt 216.8 lb

## 2021-11-09 DIAGNOSIS — E782 Mixed hyperlipidemia: Secondary | ICD-10-CM

## 2021-11-09 DIAGNOSIS — N1831 Chronic kidney disease, stage 3a: Secondary | ICD-10-CM

## 2021-11-09 DIAGNOSIS — E559 Vitamin D deficiency, unspecified: Secondary | ICD-10-CM | POA: Diagnosis not present

## 2021-11-09 DIAGNOSIS — I10 Essential (primary) hypertension: Secondary | ICD-10-CM

## 2021-11-09 DIAGNOSIS — R7302 Impaired glucose tolerance (oral): Secondary | ICD-10-CM | POA: Diagnosis not present

## 2021-11-09 DIAGNOSIS — E669 Obesity, unspecified: Secondary | ICD-10-CM

## 2021-11-09 NOTE — Progress Notes (Signed)
Established Patient Office Visit     CC/Reason for Visit: Review lab results  HPI: Rhonda Harrison is a 56 y.o. female who is coming in today for the above mentioned reasons.  She scheduled this visit after labs were drawn in July with multiple abnormalities.  She was given results via telephone, however wanted to schedule a visit to discuss more.  She has a history of hypertension, anxiety and genital HSV.  During her blood work she was diagnosed with chronic kidney disease stage III with a GFR of 56, impaired glucose tolerance with an A1c of 6.4, vitamin D deficiency, normocytic anemia as well as hyperlipidemia and consideration to starting a medium intensity statin.  She states there are many people in her family with kidney issues and diabetes and this has her very concerned.  Past Medical/Surgical History: Past Medical History:  Diagnosis Date   GAD (generalized anxiety disorder)    Genital HSV    Hyperlipidemia    Hypertension    Obesity (BMI 30.0-34.9)     No past surgical history on file.  Social History:  reports that she has never smoked. She has never used smokeless tobacco. She reports current alcohol use. She reports that she does not use drugs.  Allergies: No Known Allergies  Family History:  No family history on file.   Current Outpatient Medications:    amLODipine (NORVASC) 5 MG tablet, Take 1 tablet (5 mg total) by mouth daily., Disp: 90 tablet, Rfl: 1   Ascorbic Acid (VITAMIN C) 100 MG tablet, Take 100 mg by mouth daily., Disp: , Rfl:    atorvastatin (LIPITOR) 10 MG tablet, Take 10 mg by mouth daily., Disp: , Rfl:    atorvastatin (LIPITOR) 20 MG tablet, Take 1 tablet (20 mg total) by mouth daily., Disp: 90 tablet, Rfl: 1   buPROPion (WELLBUTRIN XL) 150 MG 24 hr tablet, Take 1 tablet (150 mg total) by mouth daily., Disp: 90 tablet, Rfl: 1   Ferrous Gluconate (FE-40 PO), Take by mouth., Disp: , Rfl:    hydrochlorothiazide (HYDRODIURIL) 25 MG tablet, Take  1 tablet (25 mg total) by mouth daily., Disp: 90 tablet, Rfl: 1   olmesartan (BENICAR) 20 MG tablet, Take 1 tablet (20 mg total) by mouth daily., Disp: 90 tablet, Rfl: 1   valACYclovir (VALTREX) 500 MG tablet, Take 1 tablet (500 mg total) by mouth daily., Disp: 90 tablet, Rfl: 1   vitamin B-12 (CYANOCOBALAMIN) 500 MCG tablet, Take 500 mcg by mouth daily., Disp: , Rfl:    Vitamin D, Ergocalciferol, (DRISDOL) 1.25 MG (50000 UNIT) CAPS capsule, Take 1 capsule (50,000 Units total) by mouth every 7 (seven) days for 12 doses., Disp: 12 capsule, Rfl: 0  Review of Systems:  Constitutional: Denies fever, chills, diaphoresis, appetite change and fatigue.  HEENT: Denies photophobia, eye pain, redness, hearing loss, ear pain, congestion, sore throat, rhinorrhea, sneezing, mouth sores, trouble swallowing, neck pain, neck stiffness and tinnitus.   Respiratory: Denies SOB, DOE, cough, chest tightness,  and wheezing.   Cardiovascular: Denies chest pain, palpitations and leg swelling.  Gastrointestinal: Denies nausea, vomiting, abdominal pain, diarrhea, constipation, blood in stool and abdominal distention.  Genitourinary: Denies dysuria, urgency, frequency, hematuria, flank pain and difficulty urinating.  Endocrine: Denies: hot or cold intolerance, sweats, changes in hair or nails, polyuria, polydipsia. Musculoskeletal: Denies myalgias, back pain, joint swelling, arthralgias and gait problem.  Skin: Denies pallor, rash and wound.  Neurological: Denies dizziness, seizures, syncope, weakness, light-headedness, numbness and headaches.  Hematological:  Denies adenopathy. Easy bruising, personal or family bleeding history  Psychiatric/Behavioral: Denies suicidal ideation, mood changes, confusion, nervousness, sleep disturbance and agitation    Physical Exam: Vitals:   11/09/21 1531  BP: 102/72  Pulse: 85  Temp: 98.5 F (36.9 C)  TempSrc: Oral  SpO2: 99%  Weight: 216 lb 12.8 oz (98.3 kg)    Body mass  index is 32.96 kg/m.   Constitutional: NAD, calm, comfortable Eyes: PERRL, lids and conjunctivae normal, wears corrective lenses ENMT: Mucous membranes are moist.  Respiratory: clear to auscultation bilaterally, no wheezing, no crackles. Normal respiratory effort. No accessory muscle use.  Cardiovascular: Regular rate and rhythm, no murmurs / rubs / gallops. No extremity edema.  Psychiatric: Normal judgment and insight. Alert and oriented x 3. Normal mood.    Impression and Plan:  Vitamin D deficiency  Stage 3a chronic kidney disease (HCC)  IGT (impaired glucose tolerance)  Primary hypertension  Mixed hyperlipidemia  Obesity (BMI 30.0-34.9)  -All labs have been discussed with her.  Main treatment at this point is lifestyle changes.  She has already lost 8 pounds.  She has requested a nutritionist referral.  She is taking vitamin D supplementation and Lipitor 20 mg that was prescribed.  She already has follow-up scheduled for November for repeat labs.  She has her sleep study and colonoscopy scheduled.  Time spent:30 minutes reviewing chart, interviewing and examining patient and formulating plan of care.    Chaya Jan, MD [facility]

## 2021-11-12 ENCOUNTER — Encounter: Payer: Self-pay | Admitting: Internal Medicine

## 2021-11-28 ENCOUNTER — Ambulatory Visit: Payer: BC Managed Care – PPO | Admitting: Neurology

## 2021-11-28 DIAGNOSIS — G4719 Other hypersomnia: Secondary | ICD-10-CM

## 2021-11-28 DIAGNOSIS — R519 Headache, unspecified: Secondary | ICD-10-CM

## 2021-11-28 DIAGNOSIS — R0683 Snoring: Secondary | ICD-10-CM

## 2021-11-28 DIAGNOSIS — E669 Obesity, unspecified: Secondary | ICD-10-CM

## 2021-11-28 DIAGNOSIS — G4730 Sleep apnea, unspecified: Secondary | ICD-10-CM

## 2021-11-28 DIAGNOSIS — Z9189 Other specified personal risk factors, not elsewhere classified: Secondary | ICD-10-CM

## 2021-11-28 DIAGNOSIS — R351 Nocturia: Secondary | ICD-10-CM

## 2021-11-28 DIAGNOSIS — G4710 Hypersomnia, unspecified: Secondary | ICD-10-CM | POA: Diagnosis not present

## 2021-11-29 NOTE — Progress Notes (Signed)
See procedure note.

## 2021-11-29 NOTE — Procedures (Signed)
   [facility]  HOME SLEEP TEST (Watch PAT) REPORT  STUDY DATE: 11/28/2021  DOB: 09/05/1965  MRN: [id number]  ORDERING CLINICIAN: Star Age, MD, PhD   REFERRING CLINICIAN: Isaac Bliss, Rayford Halsted, MD   CLINICAL INFORMATION/HISTORY:  56 year old woman with a history of hypertension, hyperlipidemia, anxiety, vitamin D deficiency, and obesity, who reports snoring and excessive daytime somnolence.   Epworth sleepiness score: 7/24.  BMI: 33.7 kg/m  FINDINGS:   Sleep Summary:   Total Recording Time (hours, min): 9 hours, 10 min  Total Sleep Time (hours, min):  8 hours, 22 min  Percent REM (%):    31.1%   Respiratory Indices:   Calculated pAHI (per hour):  4.7/hour         REM pAHI:    7/hour       NREM pAHI: 4.4/hour  Central pAHI: 0/hour  Oxygen Saturation Statistics:    Oxygen Saturation (%) Mean: 95%   Minimum oxygen saturation (%):                 91%   O2 Saturation Range (%): 91-100%    O2 Saturation (minutes) <=88%: 0 min  Pulse Rate Statistics:   Pulse Mean (bpm):    64/min    Pulse Range (50-90/min)   IMPRESSION: Primary snoring   RECOMMENDATION:  This home sleep test does not demonstrate any significant obstructive or central sleep disordered breathing with a total AHI of 4.7/hour, O2 nadir of 91%.  Snoring was detected, appear to be very intermittent and in the mild to at times moderate range. Treatment with a positive airway pressure device such as AutoPap or CPAP is not indicated, snoring may improve with avoidance of the supine sleep position and weight loss (where appropriate).  For disturbing snoring, an oral appliance through dentistry or orthodontics can be considered.  Other causes of the patient's symptoms, including circadian rhythm disturbances, an underlying mood disorder, medication effect and/or an underlying medical problem cannot be ruled out based on this test. Clinical correlation is recommended. The patient  should be cautioned not to drive, work at heights, or operate dangerous or heavy equipment when tired or sleepy. Review and reiteration of good sleep hygiene measures should be pursued with any patient. The patient will be advised to follow up with her referring provider, who will be notified of the test results.   I certify that I have reviewed the raw data recording prior to the issuance of this report in accordance with the standards of the American Academy of Sleep Medicine (AASM).   INTERPRETING PHYSICIAN:   Star Age, MD, PhD  Board Certified in Neurology and Sleep Medicine  [facility] [address] [place], [place] [address] [phone]

## 2021-12-04 ENCOUNTER — Telehealth: Payer: Self-pay | Admitting: *Deleted

## 2021-12-04 NOTE — Telephone Encounter (Signed)
-----   Message from Star Age, MD sent at 11/29/2021  7:13 PM EDT ----- Patient referred by PCP, seen by me on 10/11/2021, HST on 11/28/2021.   Please call and notify the patient that the recent home sleep test did not show any significant obstructive or central sleep disordered breathing with a total AHI of 4.7/hour, O2 nadir of 91%.  Snoring was detected, and appeared to be very intermittent and mostly in the mild range, at times moderate.  Treatment with a positive airway pressure device such as AutoPap or CPAP is not indicated, snoring may improve with avoidance of the supine sleep position and weight loss. For disturbing snoring, an oral appliance through dentistry or orthodontics can be considered.  However, for snoring, her insurance may or may not cover an oral appliance.  If she would like to get a referral to dentistry, we would be happy to facilitate.  At this juncture, she can follow-up with her primary care as scheduled/planned. Thanks,  Star Age, MD, PhD [facility])

## 2021-12-04 NOTE — Telephone Encounter (Signed)
Spoke with patient and discussed sleep study results. She verbalized understanding. She states she does not need the oral device. She did not have any questions at the time of the call. Patient verbalized appreciation for the call. Results sent to primary care.

## 2021-12-07 ENCOUNTER — Encounter: Payer: Self-pay | Admitting: Skilled Nursing Facility1

## 2021-12-07 ENCOUNTER — Encounter: Payer: BC Managed Care – PPO | Attending: Internal Medicine | Admitting: Skilled Nursing Facility1

## 2021-12-07 DIAGNOSIS — N1831 Chronic kidney disease, stage 3a: Secondary | ICD-10-CM | POA: Diagnosis not present

## 2021-12-07 NOTE — Progress Notes (Addendum)
Medical Nutrition Therapy  Appointment Start time:  3:16  Appointment End time:  4:16  Primary concerns today: CKD  Referral diagnosis: CKD Preferred learning style: visual Learning readiness: contemplating   NUTRITION ASSESSMENT   Clinical Medical Hx: hypercholesterolemia, HTN, GAD, CKD stage 3 Medications: see list Labs: Iron 41, saturation ratios 11.5, hemoglobin 10.9, HCT 33.6, GFR 56.28, A1C 6.4, cholesterol 135, vitamin D 26.44 Notable Signs/Symptoms: none reported   Body Composition Scale 12/07/2021  Current Body Weight 211  Total Body Fat % 39.4  Visceral Fat 11  Fat-Free Mass % 60.5   Total Body Water % 44.7  Muscle-Mass lbs 32.9  BMI 32  Body Fat Displacement          Torso  lbs 51.5         Left Leg  lbs 10.3         Right Leg  lbs 10.3         Left Arm  lbs 5.1         Right Arm   lbs 5.1     Lifestyle & Dietary Hx  Pt states she does not eat well skipping a lot of meals and not drinking water. Pt states she cut out soda and has been drinking more water. Pt states she cut out meat stating she was feeling really bloated.  Pt states she does eat seafood, cheese, and eggs.  Pt states she works in [place] and leaves work at [employer] to go workout.   Estimated daily fluid intake:  oz Supplements: vitamin C, iron, Vitamin D Sleep: just got a sleep study with no sleep apnea; wakes in the middle of the night looking for snacks Stress / self-care: currently controlled Current average weekly physical activity: 4 days a week walking 30  24-Hr Dietary Recall First Meal: skipped  Snack: fruits or almonds or cocnut milk yogurt Second Meal: skipped or fish + greens + mac n cheese or skipped Snack: pack of peanut butter crackers Third Meal: leftovers from lunch Snack:  Beverages: water, carbonated water, starbucks coffee   NUTRITION INTERVENTION  Nutrition education (E-1) on the following topics:  CKD and balanced meals [place] processed foods and the  kidneys Plant based proteins and the kidneys Animal based proteins and the kidneys  Handouts Provided Include  Detailed Kidney MyPlate Tofu recipe  Learning Style & Readiness for Change Teaching method utilized: Visual & Auditory  Demonstrated degree of understanding via: Teach Back  Barriers to learning/adherence to lifestyle change: work schedule   Goals Established by Pt Continue to increase your water Create balanced meals  Do not skip meals Try plant based proteins such as tofu Take the neccessary breaks at work Prediabetes and blood sugar control   MONITORING & EVALUATION Dietary intake, weekly physical activity  Next Steps  Patient is to follow up after retaking of A1C and GFR.

## 2021-12-24 ENCOUNTER — Other Ambulatory Visit: Payer: Self-pay | Admitting: Internal Medicine

## 2021-12-24 DIAGNOSIS — E559 Vitamin D deficiency, unspecified: Secondary | ICD-10-CM

## 2021-12-25 ENCOUNTER — Other Ambulatory Visit: Payer: Self-pay | Admitting: Internal Medicine

## 2021-12-25 DIAGNOSIS — E559 Vitamin D deficiency, unspecified: Secondary | ICD-10-CM

## 2021-12-30 ENCOUNTER — Encounter: Payer: Self-pay | Admitting: Internal Medicine

## 2022-01-16 ENCOUNTER — Encounter: Payer: Self-pay | Admitting: Internal Medicine

## 2022-01-16 ENCOUNTER — Ambulatory Visit: Payer: BC Managed Care – PPO | Admitting: Internal Medicine

## 2022-01-16 VITALS — BP 125/75 | HR 72 | Temp 98.2°F | Wt 214.3 lb

## 2022-01-16 DIAGNOSIS — E669 Obesity, unspecified: Secondary | ICD-10-CM | POA: Diagnosis not present

## 2022-01-16 DIAGNOSIS — Z23 Encounter for immunization: Secondary | ICD-10-CM | POA: Diagnosis not present

## 2022-01-16 DIAGNOSIS — N1831 Chronic kidney disease, stage 3a: Secondary | ICD-10-CM

## 2022-01-16 DIAGNOSIS — E559 Vitamin D deficiency, unspecified: Secondary | ICD-10-CM | POA: Diagnosis not present

## 2022-01-16 DIAGNOSIS — E782 Mixed hyperlipidemia: Secondary | ICD-10-CM

## 2022-01-16 DIAGNOSIS — E66811 Obesity, class 1: Secondary | ICD-10-CM

## 2022-01-16 DIAGNOSIS — I10 Essential (primary) hypertension: Secondary | ICD-10-CM

## 2022-01-16 DIAGNOSIS — R7303 Prediabetes: Secondary | ICD-10-CM | POA: Diagnosis not present

## 2022-01-16 LAB — COMPREHENSIVE METABOLIC PANEL
ALT: 14 U/L (ref 0–35)
AST: 18 U/L (ref 0–37)
Albumin: 4.2 g/dL (ref 3.5–5.2)
Alkaline Phosphatase: 56 U/L (ref 39–117)
BUN: 10 mg/dL (ref 6–23)
CO2: 27 mEq/L (ref 19–32)
Calcium: 9.7 mg/dL (ref 8.4–10.5)
Chloride: 105 mEq/L (ref 96–112)
Creatinine, Ser: 0.83 mg/dL (ref 0.40–1.20)
GFR: 79.6 mL/min (ref >60.00)
Glucose, Bld: 105 mg/dL — ABNORMAL HIGH (ref 70–99)
Potassium: 4 mEq/L (ref 3.5–5.1)
Sodium: 139 mEq/L (ref 135–145)
Total Bilirubin: 0.5 mg/dL (ref 0.2–1.2)
Total Protein: 7.4 g/dL (ref 6.0–8.3)

## 2022-01-16 LAB — POCT GLYCOSYLATED HEMOGLOBIN (HGB A1C): Hemoglobin A1C: 5.9 % — AB (ref 4.0–5.6)

## 2022-01-16 LAB — LIPID PANEL
Cholesterol: 125 mg/dL (ref 0–200)
HDL: 49.3 mg/dL (ref >39.00)
LDL Cholesterol: 70 mg/dL (ref 0–99)
NonHDL: 76
Total CHOL/HDL Ratio: 3
Triglycerides: 31 mg/dL (ref 0.0–149.0)
VLDL: 6.2 mg/dL (ref 0.0–40.0)

## 2022-01-16 LAB — VITAMIN D 25 HYDROXY (VIT D DEFICIENCY, FRACTURES): VITD: 37.12 ng/mL (ref 30.00–100.00)

## 2022-01-16 NOTE — Addendum Note (Signed)
Addended by: Westley Hummer B on: 01/16/2022 08:14 AM   Modules accepted: Orders

## 2022-01-16 NOTE — Progress Notes (Signed)
Established Patient Office Visit     CC/Reason for Visit: 63-month follow-up chronic medical conditions  HPI: Rhonda Harrison is a 56 y.o. female who is coming in today for the above mentioned reasons. Past Medical History is significant for: Hypertension, impaired glucose tolerance, hyperlipidemia, chronic kidney disease stage III, vitamin D deficiency.  She has been feeling well, has no acute concerns or complaints.  She has been less fatigued.  She tells me she did not have sleep apnea per her sleep study although I do not yet have this report.  She has completely changed her nutrition, she did see a dietitian who assisted.  She is requesting flu and second shingles vaccines.   Past Medical/Surgical History: Past Medical History:  Diagnosis Date   Chronic kidney disease    GAD (generalized anxiety disorder)    Genital HSV    Hyperlipidemia    Hypertension    Obesity (BMI 30.0-34.9)     No past surgical history on file.  Social History:  reports that she has never smoked. She has never used smokeless tobacco. She reports current alcohol use. She reports that she does not use drugs.  Allergies: No Known Allergies  Family History:  No history of heart disease, cancer, stroke that she is aware of   Current Outpatient Medications:    amLODipine (NORVASC) 5 MG tablet, Take 1 tablet (5 mg total) by mouth daily., Disp: 90 tablet, Rfl: 1   Ascorbic Acid (VITAMIN C) 100 MG tablet, Take 100 mg by mouth daily., Disp: , Rfl:    atorvastatin (LIPITOR) 10 MG tablet, Take 10 mg by mouth daily., Disp: , Rfl:    atorvastatin (LIPITOR) 20 MG tablet, Take 1 tablet (20 mg total) by mouth daily., Disp: 90 tablet, Rfl: 1   buPROPion (WELLBUTRIN XL) 150 MG 24 hr tablet, Take 1 tablet (150 mg total) by mouth daily., Disp: 90 tablet, Rfl: 1   Ferrous Gluconate (FE-40 PO), Take by mouth., Disp: , Rfl:    hydrochlorothiazide (HYDRODIURIL) 25 MG tablet, Take 1 tablet (25 mg total) by mouth  daily., Disp: 90 tablet, Rfl: 1   olmesartan (BENICAR) 20 MG tablet, Take 1 tablet (20 mg total) by mouth daily., Disp: 90 tablet, Rfl: 1   valACYclovir (VALTREX) 500 MG tablet, Take 1 tablet (500 mg total) by mouth daily., Disp: 90 tablet, Rfl: 1   vitamin B-12 (CYANOCOBALAMIN) 500 MCG tablet, Take 500 mcg by mouth daily., Disp: , Rfl:   Review of Systems:  Constitutional: Denies fever, chills, diaphoresis, appetite change and fatigue.  HEENT: Denies photophobia, eye pain, redness, hearing loss, ear pain, congestion, sore throat, rhinorrhea, sneezing, mouth sores, trouble swallowing, neck pain, neck stiffness and tinnitus.   Respiratory: Denies SOB, DOE, cough, chest tightness,  and wheezing.   Cardiovascular: Denies chest pain, palpitations and leg swelling.  Gastrointestinal: Denies nausea, vomiting, abdominal pain, diarrhea, constipation, blood in stool and abdominal distention.  Genitourinary: Denies dysuria, urgency, frequency, hematuria, flank pain and difficulty urinating.  Endocrine: Denies: hot or cold intolerance, sweats, changes in hair or nails, polyuria, polydipsia. Musculoskeletal: Denies myalgias, back pain, joint swelling, arthralgias and gait problem.  Skin: Denies pallor, rash and wound.  Neurological: Denies dizziness, seizures, syncope, weakness, light-headedness, numbness and headaches.  Hematological: Denies adenopathy. Easy bruising, personal or family bleeding history  Psychiatric/Behavioral: Denies suicidal ideation, mood changes, confusion, nervousness, sleep disturbance and agitation    Physical Exam: Vitals:   01/16/22 0711 01/16/22 0715 01/16/22 0744  BP: (!) 140/90 Marland Kitchen)  139/97 125/75  Pulse: 72    Temp: 98.2 F (36.8 C)    TempSrc: Oral    SpO2: 98%    Weight: 214 lb 4.8 oz (97.2 kg)      Body mass index is 32.58 kg/m.   Constitutional: NAD, calm, comfortable Eyes: PERRL, lids and conjunctivae normal, wears corrective lenses ENMT: Mucous membranes  are moist.  Respiratory: clear to auscultation bilaterally, no wheezing, no crackles. Normal respiratory effort. No accessory muscle use.  Cardiovascular: Regular rate and rhythm, no murmurs / rubs / gallops. No extremity edema.Marland Kitchen  Psychiatric: Normal judgment and insight. Alert and oriented x 3. Normal mood.    Impression and Plan:  Prediabetes - Plan: POCT glycosylated hemoglobin (Hb A1C)  Primary hypertension  Mixed hyperlipidemia - Plan: Lipid panel  Obesity (BMI 30.0-34.9)  Stage 3a chronic kidney disease ([place]) - Plan: Comprehensive metabolic panel  Need for influenza vaccination  Need for shingles vaccine  Vitamin D deficiency - Plan: VITAMIN D 25 Hydroxy (Vit-D Deficiency, Fractures)  -She has been working hard on lifestyle changes, she has been able to reduce her A1c from 6.4-5.9. -Even though blood pressure is elevated today in office, she states her home measurements average 125/75.  I will have her do careful blood pressure monitoring at home and she will contact us if her blood pressure is consistently above 130/80 for further titration of antihypertensives. -She has been taking atorvastatin 20 mg daily, she is due to have cholesterol rechecked today.  Also due for vitamin D and kidney function rechecks. -Flu and final shingles vaccines have been administered today.  Time spent:33 minutes reviewing chart, interviewing and examining patient and formulating plan of care.       Lelon Frohlich, MD [facility]

## 2022-01-29 ENCOUNTER — Encounter: Payer: BC Managed Care – PPO | Attending: Internal Medicine | Admitting: Skilled Nursing Facility1

## 2022-01-29 ENCOUNTER — Encounter: Payer: Self-pay | Admitting: Skilled Nursing Facility1

## 2022-01-29 DIAGNOSIS — N1831 Chronic kidney disease, stage 3a: Secondary | ICD-10-CM | POA: Diagnosis not present

## 2022-01-29 NOTE — Progress Notes (Signed)
Medical Nutrition Therapy   Primary concerns today: CKD  Referral diagnosis: CKD Preferred learning style: visual Learning readiness: contemplating   NUTRITION ASSESSMENT   Clinical Medical Hx: hypercholesterolemia, HTN, GAD, CKD stage 3 Medications: see list Labs: A1C down to 5.9 from 6.4 Notable Signs/Symptoms: none reported   Body Composition Scale 12/07/2021 01/29/2022  Current Body Weight 211 209.1  Total Body Fat % 39.4 39.8  Visceral Fat 11 11  Fat-Free Mass % 60.5 60.1   Total Body Water % 44.7 44.5  Muscle-Mass lbs 32.9 32.2  BMI 32 31.7  Body Fat Displacement           Torso  lbs 51.5 51.5         Left Leg  lbs 10.3 10.3         Right Leg  lbs 10.3 10.3         Left Arm  lbs 5.1 5.1         Right Arm   lbs 5.1 5.1     Lifestyle & Dietary Hx  Pt states she works in [place] and leaves work at [employer] to go workout.   Pt states she cut out soda and drinks more water.  Pt states she just thinks of working out as something to reduce her stress not about weight loss which keeps her active. Pt states she has been wanting more seafood and cut back on beef and pork. Pt state she has been closing her door during lunch to actually get her lunch in and since that change has not been nodding off on the way home to work.   Pt arrives with all of her labs substantially improved including her GFR and she is feeling well!   Estimated daily fluid intake:  oz Supplements: vitamin C, iron, Vitamin D Sleep: just got a sleep study with no sleep apnea; wakes in the middle of the night looking for snacks Stress / self-care: currently controlled Current average weekly physical activity: 4 days a week walking 30  24-Hr Dietary Recall First Meal: smoothie: fruit, almond milk, almond butter, greek yogurt, honey Snack: fruits or almonds or cocnut milk yogurt Second Meal: salmon or shrimp or tilapia + sweet potato or white or brown rice + spianch  +green benas + salad Snack: pack of  peanut butter crackers Third Meal: salmon or shrimp or tilapia + sweet potato or white or brown rice or spinach  or green beans or salad Snack:  Beverages: water, carbonated water, starbucks coffee   NUTRITION INTERVENTION: previous Nutrition education (E-1) on the following topics:  CKD and balanced meals [place] processed foods and the kidneys Plant based proteins and the kidneys Animal based proteins and the kidneys  Handouts Provided Include: previous Detailed Kidney MyPlate Tofu recipe  Learning Style & Readiness for Change Teaching method utilized: Visual & Auditory  Demonstrated degree of understanding via: Teach Back  Barriers to learning/adherence to lifestyle change: work schedule   Goals Established by Pt Take a maintenance dose of vitamin D daily 800 units You are doing fantastic! Keep it up!   MONITORING & EVALUATION Dietary intake, weekly physical activity  Next Steps  Patient is to follow up after retaking labs per pt desire

## 2022-03-18 ENCOUNTER — Encounter: Payer: Self-pay | Admitting: Internal Medicine

## 2022-03-18 DIAGNOSIS — I10 Essential (primary) hypertension: Secondary | ICD-10-CM

## 2022-03-19 MED ORDER — HYDROCHLOROTHIAZIDE 25 MG PO TABS: 25.0000 mg | ORAL_TABLET | Freq: Every day | ORAL | 1 refills | Status: DC

## 2022-03-19 MED ORDER — OLMESARTAN MEDOXOMIL 20 MG PO TABS: 20.0000 mg | ORAL_TABLET | Freq: Every day | ORAL | 1 refills | Status: DC

## 2022-03-19 MED ORDER — AMLODIPINE BESYLATE 5 MG PO TABS: 5.0000 mg | ORAL_TABLET | Freq: Every day | ORAL | 1 refills | Status: DC

## 2022-04-18 ENCOUNTER — Ambulatory Visit: Payer: BC Managed Care – PPO | Admitting: Internal Medicine

## 2022-06-28 ENCOUNTER — Ambulatory Visit: Payer: BC Managed Care – PPO | Admitting: Family Medicine

## 2022-06-28 ENCOUNTER — Encounter: Payer: Self-pay | Admitting: Family Medicine

## 2022-06-28 VITALS — BP 126/76 | HR 84 | Temp 98.9°F | Ht 68.0 in | Wt 212.6 lb

## 2022-06-28 DIAGNOSIS — F4321 Adjustment disorder with depressed mood: Secondary | ICD-10-CM | POA: Diagnosis not present

## 2022-06-28 DIAGNOSIS — F411 Generalized anxiety disorder: Secondary | ICD-10-CM

## 2022-06-28 MED ORDER — HYDROXYZINE HCL 25 MG PO TABS: 12.5000 mg | ORAL_TABLET | Freq: Three times a day (TID) | ORAL | 0 refills | Status: AC | PRN

## 2022-06-28 NOTE — Patient Instructions (Addendum)
A prescription for hydroxyzine 12.5 mg was sent to your pharmacy.  You can take this medication up to 3 times a day if needed for anxiety symptoms.  If needed you can take a whole tab (25 mg) up to 3 times a day.  At first try taking the 12.5 mg dose at night to see if it makes you sleepy.  If it does not cause drowsiness you can take it during the day.   Behavioral Health Services: -to make an appointment contact the office/provider you are interested in seeing.  No referral is needed.  The below is not an all inclusive list, but will help you get started.  [email or web address] -counseling located off of [address]  [email or web address] -website helps you find providers in your area  [facility] -Located off of [address]. across from [place]  Dr. Jannifer Franklin is a Therapist, sports with [facility]. [phone]  [facility]ing and wellness  [facility]  -[address]  [phone] -a place in town that has counseling and Psychiatry services.

## 2022-06-28 NOTE — Progress Notes (Signed)
Established Patient Office Visit   Subjective  Patient ID: Rhonda Harrison, female    DOB: January 08, 1966  Age: 57 y.o. MRN: [id number]  Chief Complaint  Patient presents with   Abdominal Pain    Patient complains of abdominal pain, x2 weeks   Tremors    Patient complains of tremors, x2 weeks    Palpitations    Patient complains of palpitations, x2 weeks     Patient is a 57 year old female followed by Dr. Ardyth Harps and seen for acute concern.  Patient endorses being on Wellbutrin XL 150 mg times a while.  States medication does not seem to be helping with current symptoms.  Patient dealing with the sudden death of her brother who was living with her.  Pt found him down and perform CPR.  Pt at the next day.  His funeral was this past Saturday.  Patient experiencing abdominal pain, palpitations, decreased appetite, tremors, "foggy headedness" since the event.  Patient endorses labile all the lights on at night in the house.  Has a counseling appointment through [facility] at the beginning of May.    Past Medical History:  Diagnosis Date   Chronic kidney disease    GAD (generalized anxiety disorder)    Genital HSV    Hyperlipidemia    Hypertension    Obesity (BMI 30.0-34.9)    Social History   Tobacco Use   Smoking status: Never   Smokeless tobacco: Never  Vaping Use   Vaping Use: Never used  Substance Use Topics   Alcohol use: Yes    Comment: socially   Drug use: Never   History reviewed. No pertinent family history. No Known Allergies    ROS Negative unless stated above    Objective:     BP 126/76 (BP Location: Left Arm, Patient Position: Sitting, Cuff Size: Normal)   Pulse 84   Temp 98.9 F (37.2 C) (Oral)   Ht  (1.727 m)   Wt 212 lb 9.6 oz (96.4 kg)   SpO2 99%   BMI 32.33 kg/m  BP Readings from Last 3 Encounters:  06/28/22 126/76  01/16/22 125/75  11/09/21 102/72   Wt Readings from Last 3 Encounters:  06/28/22 212 lb 9.6 oz (96.4 kg)  01/16/22 214 lb  4.8 oz (97.2 kg)  12/07/21 211 lb (95.7 kg)      Physical Exam Constitutional:      General: She is not in acute distress.    Appearance: Normal appearance.     Comments: Tearful  HENT:     Head: Normocephalic and atraumatic.     Nose: Nose normal.     Mouth/Throat:     Mouth: Mucous membranes are moist.  Eyes:     Pupils: Pupils are equal, round, and reactive to light.  Cardiovascular:     Rate and Rhythm: Normal rate and regular rhythm.     Heart sounds: Normal heart sounds. No murmur heard.    No gallop.  Pulmonary:     Effort: Pulmonary effort is normal. No respiratory distress.     Breath sounds: Normal breath sounds. No wheezing, rhonchi or rales.  Skin:    General: Skin is warm and dry.  Neurological:     Mental Status: She is alert and oriented to person, place, and time.  Psychiatric:        Mood and Affect: Mood is depressed. Affect is tearful.        Behavior: Behavior is cooperative.  Thought Content: Thought content normal.      No results found for any visits on 06/28/22.    Assessment & Plan:  Grief  GAD (generalized anxiety disorder) -     hydrOXYzine HCl; Take 0.5 tablets (12.5 mg total) by mouth 3 (three) times daily as needed.  Dispense: 30 tablet; Refill: 0  Encouraged to proceed with counseling for grief.  Hydroxyzine 12.5 mg prn. Advised may cause drowsiness.  Continue Wellbutrin XL 150 mg daily.  Has family support.  Given precautions.  Return in about 4 weeks (around 07/26/2022), or with pcp if symptoms worsen or fail to improve.   Deeann Saint, MD

## 2022-07-20 DIAGNOSIS — F411 Generalized anxiety disorder: Secondary | ICD-10-CM | POA: Diagnosis not present

## 2022-07-20 DIAGNOSIS — F330 Major depressive disorder, recurrent, mild: Secondary | ICD-10-CM | POA: Diagnosis not present

## 2022-08-29 ENCOUNTER — Encounter: Payer: Self-pay | Admitting: Internal Medicine

## 2022-08-29 ENCOUNTER — Telehealth (INDEPENDENT_AMBULATORY_CARE_PROVIDER_SITE_OTHER): Payer: BC Managed Care – PPO | Admitting: Internal Medicine

## 2022-08-29 DIAGNOSIS — F411 Generalized anxiety disorder: Secondary | ICD-10-CM

## 2022-08-29 DIAGNOSIS — A6000 Herpesviral infection of urogenital system, unspecified: Secondary | ICD-10-CM

## 2022-08-29 MED ORDER — VALACYCLOVIR HCL 500 MG PO TABS: 500.0000 mg | ORAL_TABLET | Freq: Every day | ORAL | 1 refills | Status: AC

## 2022-08-29 MED ORDER — BUPROPION HCL ER (XL) 150 MG PO TB24: 150.0000 mg | ORAL_TABLET | Freq: Every day | ORAL | 1 refills | Status: DC

## 2022-08-29 NOTE — Progress Notes (Signed)
Virtual Visit via Video Note  I connected with Rhonda Harrison on 08/29/22 at  4:00 PM EDT by a video enabled telemedicine application and verified that I am speaking with the correct person using two identifiers.  Location patient: home Location provider: work office Persons participating in the virtual visit: patient, provider  I discussed the limitations of evaluation and management by telemedicine and the availability of in person appointments. The patient expressed understanding and agreed to proceed.   HPI: This visit has been scheduled for medication refills.  She is needing refills of Wellbutrin and valacyclovir.  Her brother unfortunately passed away at her home in 08/01/2022.  She is still having a hard time dealing with this.  She is trying to secure CBT.  She took hydroxyzine short-term but is not interested in continuing.   ROS: Negative unless indicated in HPI.  Past Medical History:  Diagnosis Date   Chronic kidney disease    GAD (generalized anxiety disorder)    Genital HSV    Hyperlipidemia    Hypertension    Obesity (BMI 30.0-34.9)     No past surgical history on file.  No family history on file.  SOCIAL HX:   reports that she has never smoked. She has never used smokeless tobacco. She reports current alcohol use. She reports that she does not use drugs.   Current Outpatient Medications:    amLODipine (NORVASC) 5 MG tablet, Take 1 tablet (5 mg total) by mouth daily., Disp: 90 tablet, Rfl: 1   Ascorbic Acid (VITAMIN C) 100 MG tablet, Take 100 mg by mouth daily., Disp: , Rfl:    atorvastatin (LIPITOR) 20 MG tablet, Take 1 tablet (20 mg total) by mouth daily., Disp: 90 tablet, Rfl: 1   Ferrous Gluconate (FE-40 PO), Take by mouth., Disp: , Rfl:    hydrochlorothiazide (HYDRODIURIL) 25 MG tablet, Take 1 tablet (25 mg total) by mouth daily., Disp: 90 tablet, Rfl: 1   hydrOXYzine (ATARAX) 25 MG tablet, Take 0.5 tablets (12.5 mg total) by mouth 3 (three) times  daily as needed., Disp: 30 tablet, Rfl: 0   olmesartan (BENICAR) 20 MG tablet, Take 1 tablet (20 mg total) by mouth daily., Disp: 90 tablet, Rfl: 1   vitamin B-12 (CYANOCOBALAMIN) 500 MCG tablet, Take 500 mcg by mouth daily., Disp: , Rfl:    buPROPion (WELLBUTRIN XL) 150 MG 24 hr tablet, Take 1 tablet (150 mg total) by mouth daily., Disp: 90 tablet, Rfl: 1   valACYclovir (VALTREX) 500 MG tablet, Take 1 tablet (500 mg total) by mouth daily., Disp: 90 tablet, Rfl: 1  EXAM:   VITALS per patient if applicable: None reported  GENERAL: alert, oriented, appears well and in no acute distress  HEENT: atraumatic, conjunttiva clear, no obvious abnormalities on inspection of external nose and ears  NECK: normal movements of the head and neck  LUNGS: on inspection no signs of respiratory distress, breathing rate appears normal, no obvious gross increased work of breathing, gasping or wheezing  CV: no obvious cyanosis  MS: moves all visible extremities without noticeable abnormality  PSYCH/NEURO: pleasant and cooperative, mood is depressed, sad, speech and thought processing grossly intact  ASSESSMENT AND PLAN:   GAD (generalized anxiety disorder) - Plan: buPROPion (WELLBUTRIN XL) 150 MG 24 hr tablet  Genital herpes simplex, unspecified site - Plan: valACYclovir (VALTREX) 500 MG tablet  -Medications have been refilled, encouraged CBT.   I discussed the assessment and treatment plan with the patient. The patient was provided an  opportunity to ask questions and all were answered. The patient agreed with the plan and demonstrated an understanding of the instructions.   The patient was advised to call back or seek an in-person evaluation if the symptoms worsen or if the condition fails to improve as anticipated.    Chaya Jan, MD  [facility] at [facility]

## 2022-10-02 DIAGNOSIS — L821 Other seborrheic keratosis: Secondary | ICD-10-CM | POA: Diagnosis not present

## 2022-10-12 ENCOUNTER — Other Ambulatory Visit: Payer: Self-pay | Admitting: Internal Medicine

## 2022-10-12 ENCOUNTER — Encounter: Payer: Self-pay | Admitting: Internal Medicine

## 2022-10-12 MED ORDER — ATORVASTATIN CALCIUM 20 MG PO TABS: 20.0000 mg | ORAL_TABLET | Freq: Every day | ORAL | 0 refills | Status: DC

## 2022-11-08 DIAGNOSIS — F4312 Post-traumatic stress disorder, chronic: Secondary | ICD-10-CM | POA: Diagnosis not present

## 2022-11-13 DIAGNOSIS — F4312 Post-traumatic stress disorder, chronic: Secondary | ICD-10-CM | POA: Diagnosis not present

## 2022-11-15 ENCOUNTER — Encounter: Payer: Self-pay | Admitting: Family Medicine

## 2022-11-15 ENCOUNTER — Ambulatory Visit (INDEPENDENT_AMBULATORY_CARE_PROVIDER_SITE_OTHER): Payer: BC Managed Care – PPO

## 2022-11-15 ENCOUNTER — Telehealth (INDEPENDENT_AMBULATORY_CARE_PROVIDER_SITE_OTHER): Payer: BC Managed Care – PPO | Admitting: Family Medicine

## 2022-11-15 DIAGNOSIS — R5383 Other fatigue: Secondary | ICD-10-CM

## 2022-11-15 DIAGNOSIS — U071 COVID-19: Secondary | ICD-10-CM | POA: Diagnosis not present

## 2022-11-15 DIAGNOSIS — R6889 Other general symptoms and signs: Secondary | ICD-10-CM

## 2022-11-15 DIAGNOSIS — R051 Acute cough: Secondary | ICD-10-CM | POA: Diagnosis not present

## 2022-11-15 LAB — POC COVID19 BINAXNOW: SARS Coronavirus 2 Ag: POSITIVE — AB

## 2022-11-15 MED ORDER — NIRMATRELVIR/RITONAVIR (PAXLOVID)TABLET: 3.0000 | ORAL_TABLET | Freq: Two times a day (BID) | ORAL | 0 refills | Status: AC

## 2022-11-15 NOTE — Progress Notes (Signed)
Virtual Visit via Video Note  I connected with Rhonda Harrison on 11/15/22 at 11:00 AM EDT by a video enabled telemedicine application and verified that I am speaking with the correct person using two identifiers.  Location patient: home Location provider:work or home office Persons participating in the virtual visit: patient, provider  I discussed the limitations of evaluation and management by telemedicine and the availability of in person appointments. The patient expressed understanding and agreed to proceed.  Chief Complaint  Patient presents with   Covid Positive    Started Monday, congestion, sore throat, coughing weakness, temp, chills     HPI: Patient is a 57 year old female followed by Dr. Ardyth Harps and seen for acute concern.  Pt went to [place] on the wknd.  On Monday she had a sore throat, temp of 101F, loose stools, and fatigue.  COVID test was negative that day.  Pt went to work on Tuesday with cough, chest congestion/heaviness, chills, decreased appetite.  Symptoms improving a little today.  Pt took tylenol and tylenol  ROS: See pertinent positives and negatives per HPI.  Past Medical History:  Diagnosis Date   Chronic kidney disease    GAD (generalized anxiety disorder)    Genital HSV    Hyperlipidemia    Hypertension    Obesity (BMI 30.0-34.9)     No past surgical history on file.  No family history on file.  Current Outpatient Medications:    amLODipine (NORVASC) 5 MG tablet, Take 1 tablet (5 mg total) by mouth daily., Disp: 90 tablet, Rfl: 1   Ascorbic Acid (VITAMIN C) 100 MG tablet, Take 100 mg by mouth daily., Disp: , Rfl:    atorvastatin (LIPITOR) 20 MG tablet, TAKE 1 TABLET(20 MG) BY MOUTH DAILY, Disp: 30 tablet, Rfl: 0   buPROPion (WELLBUTRIN XL) 150 MG 24 hr tablet, Take 1 tablet (150 mg total) by mouth daily., Disp: 90 tablet, Rfl: 1   Ferrous Gluconate (FE-40 PO), Take by mouth., Disp: , Rfl:    hydrochlorothiazide (HYDRODIURIL) 25 MG tablet, Take 1  tablet (25 mg total) by mouth daily., Disp: 90 tablet, Rfl: 1   hydrOXYzine (ATARAX) 25 MG tablet, Take 0.5 tablets (12.5 mg total) by mouth 3 (three) times daily as needed., Disp: 30 tablet, Rfl: 0   olmesartan (BENICAR) 20 MG tablet, Take 1 tablet (20 mg total) by mouth daily., Disp: 90 tablet, Rfl: 1   valACYclovir (VALTREX) 500 MG tablet, Take 1 tablet (500 mg total) by mouth daily., Disp: 90 tablet, Rfl: 1   vitamin B-12 (CYANOCOBALAMIN) 500 MCG tablet, Take 500 mcg by mouth daily., Disp: , Rfl:   EXAM:  VITALS per patient if applicable: RR between 12-20 bpm  GENERAL: alert, oriented, appears well and in no acute distress  HEENT: atraumatic, conjunctiva clear, no obvious abnormalities on inspection of external nose and ears  NECK: normal movements of the head and neck  LUNGS: on inspection no signs of respiratory distress, breathing rate appears normal, no obvious gross SOB, gasping or wheezing  CV: no obvious cyanosis  MS: moves all visible extremities without noticeable abnormality  PSYCH/NEURO: pleasant and cooperative, no obvious depression or anxiety, speech and thought processing grossly intact  ASSESSMENT AND PLAN:  Discussed the following assessment and plan:  COVID-19 virus infection - Plan: nirmatrelvir/ritonavir (PAXLOVID) 20 x 150 MG & 10 x 100MG  TABS  Other fatigue  Acute cough  URI symptoms starting 11/12/2022 with positive COVID test today, 11/15/2022.  The symptoms seem to be improving slowly discussed  r/b/a of antiviral medications.  Patient wishes to start Paxlovid.  Prescription sent to pharmacy.  Continue supportive care.  Discussed OTC cough and cold medications for people with high blood pressure, rest, hydration, Tylenol, etc.  Given strict precautions.  Follow-up as needed   I discussed the assessment and treatment plan with the patient. The patient was provided an opportunity to ask questions and all were answered. The patient agreed with the plan and  demonstrated an understanding of the instructions.   The patient was advised to call back or seek an in-person evaluation if the symptoms worsen or if the condition fails to improve as anticipated.   Deeann Saint, MD

## 2022-11-19 DIAGNOSIS — F4312 Post-traumatic stress disorder, chronic: Secondary | ICD-10-CM | POA: Diagnosis not present

## 2022-11-29 DIAGNOSIS — F4312 Post-traumatic stress disorder, chronic: Secondary | ICD-10-CM | POA: Diagnosis not present

## 2022-12-10 DIAGNOSIS — F4312 Post-traumatic stress disorder, chronic: Secondary | ICD-10-CM | POA: Diagnosis not present

## 2022-12-21 ENCOUNTER — Ambulatory Visit (INDEPENDENT_AMBULATORY_CARE_PROVIDER_SITE_OTHER): Payer: BC Managed Care – PPO

## 2022-12-21 ENCOUNTER — Ambulatory Visit
Admission: EM | Admit: 2022-12-21 | Discharge: 2022-12-21 | Disposition: A | Discharge status: Home / Self Care | Payer: BC Managed Care – PPO | Attending: Family Medicine | Admitting: Family Medicine

## 2022-12-21 ENCOUNTER — Other Ambulatory Visit: Payer: Self-pay

## 2022-12-21 ENCOUNTER — Encounter: Payer: Self-pay | Admitting: Emergency Medicine

## 2022-12-21 DIAGNOSIS — M79642 Pain in left hand: Secondary | ICD-10-CM

## 2022-12-21 DIAGNOSIS — S6992XA Unspecified injury of left wrist, hand and finger(s), initial encounter: Secondary | ICD-10-CM | POA: Diagnosis not present

## 2022-12-21 DIAGNOSIS — M79643 Pain in unspecified hand: Secondary | ICD-10-CM | POA: Insufficient documentation

## 2022-12-21 MED ORDER — TRAMADOL HCL 50 MG PO TABS: 50.0000 mg | ORAL_TABLET | Freq: Four times a day (QID) | ORAL | 0 refills | Status: DC | PRN

## 2022-12-21 NOTE — ED Provider Notes (Signed)
Renaldo Fiddler    CSN: [id number] Arrival date & time: 12/21/22  1907      History   Chief Complaint Chief Complaint  Patient presents with   Hand Pain    HPI Rhonda Harrison is a 57 y.o. female.    Hand Pain  Here for pain in her left hand.  Principally it is around the PIP and proximal phalanx of the left index finger.  She has a little bit of pain around her thenar eminence and on the middle finger also. This morning she fell onto her hand to break her fall.  She did not notice that much pain until after she woke up from a nap and found it to be stiff and a little swollen and hard to make a fist.  She is not allergic any medication   Past Medical History:  Diagnosis Date   Chronic kidney disease    GAD (generalized anxiety disorder)    Genital HSV    Hyperlipidemia    Hypertension    Obesity (BMI 30.0-34.9)     Patient Active Problem List   Diagnosis Date Noted   Hand pain 12/21/2022   Vitamin D deficiency 10/02/2021   CKD (chronic kidney disease) stage 3, GFR 30-59 ml/min (HCC) 10/02/2021   Normocytic anemia 10/02/2021   IGT (impaired glucose tolerance) 10/02/2021   Hypertension 07/03/2021   Hyperlipidemia 07/03/2021   GAD (generalized anxiety disorder) 07/03/2021   Obesity (BMI 30.0-34.9) 07/03/2021   Genital HSV 07/03/2021    History reviewed. No pertinent surgical history.  OB History     Gravida  3   Para      Term      Preterm      AB  1   Living  2      SAB  1   IAB      Ectopic      Multiple      Live Births               Home Medications    Prior to Admission medications   Medication Sig Start Date End Date Taking? Authorizing Provider  traMADol (ULTRAM) 50 MG tablet Take 1 tablet (50 mg total) by mouth every 6 (six) hours as needed (pain). 12/21/22  Yes Zenia Resides, MD  amLODipine (NORVASC) 5 MG tablet Take 1 tablet (5 mg total) by mouth daily. 03/19/22   Philip Aspen, Limmie Patricia, MD  Ascorbic  Acid (VITAMIN C) 100 MG tablet Take 100 mg by mouth daily.    [provider]  atorvastatin (LIPITOR) 20 MG tablet TAKE 1 TABLET(20 MG) BY MOUTH DAILY 10/12/22   Philip Aspen, Limmie Patricia, MD  buPROPion (WELLBUTRIN XL) 150 MG 24 hr tablet Take 1 tablet (150 mg total) by mouth daily. 08/29/22   Philip Aspen, Limmie Patricia, MD  Ferrous Gluconate (FE-40 PO) Take by mouth.    [provider]  hydrochlorothiazide (HYDRODIURIL) 25 MG tablet Take 1 tablet (25 mg total) by mouth daily. 03/19/22   Philip Aspen, Limmie Patricia, MD  hydrOXYzine (ATARAX) 25 MG tablet Take 0.5 tablets (12.5 mg total) by mouth 3 (three) times daily as needed. 06/28/22   Deeann Saint, MD  olmesartan (BENICAR) 20 MG tablet Take 1 tablet (20 mg total) by mouth daily. 03/19/22   Philip Aspen, Limmie Patricia, MD  valACYclovir (VALTREX) 500 MG tablet Take 1 tablet (500 mg total) by mouth daily. 08/29/22   Philip Aspen, Limmie Patricia, MD  vitamin B-12 (CYANOCOBALAMIN)  500 MCG tablet Take 500 mcg by mouth daily.    [provider]    Family History History reviewed. No pertinent family history.  Social History Social History   Tobacco Use   Smoking status: Never   Smokeless tobacco: Never  Vaping Use   Vaping status: Never Used  Substance Use Topics   Alcohol use: Yes    Comment: socially   Drug use: Never     Allergies   Patient has no known allergies.   Review of Systems Review of Systems   Physical Exam Triage Vital Signs ED Triage Vitals  Encounter Vitals Group     BP 12/21/22 1916 116/81     Systolic BP Percentile --      Diastolic BP Percentile --      Pulse Rate 12/21/22 1916 84     Resp 12/21/22 1916 16     Temp 12/21/22 1916 98.7 F (37.1 C)     Temp Source 12/21/22 1916 Oral     SpO2 12/21/22 1916 96 %     Weight --      Height --      Head Circumference --      Peak Flow --      Pain Score 12/21/22 1921 5     Pain Loc --      Pain Education --      Exclude from Growth  Chart --    No data found.  Updated Vital Signs BP 116/81 (BP Location: Right Arm)   Pulse 84   Temp 98.7 F (37.1 C) (Oral)   Resp 16   SpO2 96%   Visual Acuity Right Eye Distance:   Left Eye Distance:   Bilateral Distance:    Right Eye Near:   Left Eye Near:    Bilateral Near:     Physical Exam Vitals reviewed.  Musculoskeletal:     Comments: There is a little swelling of the second and third MCP and of the PIP of the left index finger.  No obvious ecchymosis and no deformity.  Skin:    Coloration: Skin is not jaundiced or pale.  Neurological:     Mental Status: She is alert and oriented to person, place, and time.  Psychiatric:        Behavior: Behavior normal.      UC Treatments / Results  Labs (all labs ordered are listed, but only abnormal results are displayed) Labs Reviewed - No data to display  EKG   Radiology No results found.  Procedures Procedures (including critical care time)  Medications Ordered in UC Medications - No data to display  Initial Impression / Assessment and Plan / UC Course  I have reviewed the triage vital signs and the nursing notes.  Pertinent labs & imaging results that were available during my care of the patient were reviewed by me and considered in my medical decision making (see chart for details).     There is a possible fracture at the base of her middle phalanx.  Finger splint is applied and tramadol sent in for pain relief.  She is advised of radiology over read.  She is given con to information for orthopedics. Final Clinical Impressions(s) / UC Diagnoses   Final diagnoses:  Pain of left hand     Discharge Instructions      I think there is a fracture at the base of your middle phalanx on your left pointer finger.  Radiology will read the x-ray also and  the report will go to your MyChart.  Take tramadol 50 mg-- 1 tablet every 6 hours as needed for pain.  This medication can make you sleepy or  dizzy  Ice and elevate your finger and hand tonight.     ED Prescriptions     Medication Sig Dispense Auth. Provider   traMADol (ULTRAM) 50 MG tablet Take 1 tablet (50 mg total) by mouth every 6 (six) hours as needed (pain). 12 tablet Radiance Deady, Janace Aris, MD      I have reviewed the PDMP during this encounter.   Zenia Resides, MD 12/21/22 2005

## 2022-12-21 NOTE — ED Triage Notes (Signed)
Pt here for left hand pain after trip and fall today landing on hand; pt sts too painful to close her fist

## 2022-12-21 NOTE — Discharge Instructions (Addendum)
I think there is a fracture at the base of your middle phalanx on your left pointer finger.  Radiology will read the x-ray also and the report will go to your MyChart.  Take tramadol 50 mg-- 1 tablet every 6 hours as needed for pain.  This medication can make you sleepy or dizzy  Ice and elevate your finger and hand tonight.

## 2022-12-27 DIAGNOSIS — F4312 Post-traumatic stress disorder, chronic: Secondary | ICD-10-CM | POA: Diagnosis not present

## 2022-12-31 ENCOUNTER — Ambulatory Visit: Payer: BC Managed Care – PPO | Admitting: Internal Medicine

## 2022-12-31 ENCOUNTER — Other Ambulatory Visit: Payer: Self-pay | Admitting: Internal Medicine

## 2022-12-31 ENCOUNTER — Encounter: Payer: Self-pay | Admitting: Internal Medicine

## 2022-12-31 VITALS — BP 130/86 | HR 69 | Temp 98.3°F | Wt 233.4 lb

## 2022-12-31 DIAGNOSIS — Z23 Encounter for immunization: Secondary | ICD-10-CM | POA: Diagnosis not present

## 2022-12-31 DIAGNOSIS — I10 Essential (primary) hypertension: Secondary | ICD-10-CM

## 2022-12-31 DIAGNOSIS — R7302 Impaired glucose tolerance (oral): Secondary | ICD-10-CM

## 2022-12-31 DIAGNOSIS — N1831 Chronic kidney disease, stage 3a: Secondary | ICD-10-CM

## 2022-12-31 DIAGNOSIS — E782 Mixed hyperlipidemia: Secondary | ICD-10-CM

## 2022-12-31 LAB — POCT GLYCOSYLATED HEMOGLOBIN (HGB A1C): Hemoglobin A1C: 6.2 % — AB (ref 4.0–5.6)

## 2022-12-31 MED ORDER — AMLODIPINE BESYLATE 5 MG PO TABS
5.0000 mg | ORAL_TABLET | Freq: Every day | ORAL | 1 refills | Status: DC
Start: 2022-12-31 — End: 2023-04-04

## 2022-12-31 MED ORDER — OLMESARTAN MEDOXOMIL 20 MG PO TABS
20.0000 mg | ORAL_TABLET | Freq: Every day | ORAL | 1 refills | Status: DC
Start: 2022-12-31 — End: 2023-04-04

## 2022-12-31 MED ORDER — HYDROCHLOROTHIAZIDE 25 MG PO TABS
25.0000 mg | ORAL_TABLET | Freq: Every day | ORAL | 1 refills | Status: DC
Start: 2022-12-31 — End: 2023-04-04

## 2022-12-31 MED ORDER — WEGOVY 0.25 MG/0.5ML ~~LOC~~ SOAJ
0.2500 mg | SUBCUTANEOUS | 0 refills | Status: DC
Start: 2022-12-31 — End: 2023-03-20

## 2022-12-31 NOTE — Assessment & Plan Note (Signed)
-  Discussed healthy lifestyle, including increased physical activity and better food choices to promote weight loss. -After discussion she has elected to see if her insurance will cover [facility].

## 2022-12-31 NOTE — Assessment & Plan Note (Signed)
Check fasting lipids when she returns for CPE next month.

## 2022-12-31 NOTE — Progress Notes (Signed)
Established Patient Office Visit     CC/Reason for Visit: Follow-up chronic conditions  HPI: Rhonda Harrison is a 57 y.o. female who is coming in today for the above mentioned reasons. Past Medical History is significant for: Hypertension, hyperlipidemia, impaired glucose tolerance, chronic kidney disease stage III, vitamin D deficiency, morbid obesity.  She has been diagnosed with PTSD and is working through CBT at the moment.  Requesting flu vaccine.  She has been out of blood pressure medication for a few days.  She has gained some weight, would like to discuss obesity management.   Past Medical/Surgical History: Past Medical History:  Diagnosis Date   Chronic kidney disease    GAD (generalized anxiety disorder)    Genital HSV    Hyperlipidemia    Hypertension    Obesity (BMI 30.0-34.9)     History reviewed. No pertinent surgical history.  Social History:  reports that she has never smoked. She has never used smokeless tobacco. She reports current alcohol use. She reports that she does not use drugs.  Allergies: No Known Allergies  Family History:  History reviewed. No pertinent family history.   Current Outpatient Medications:    Ascorbic Acid (VITAMIN C) 100 MG tablet, Take 100 mg by mouth daily., Disp: , Rfl:    atorvastatin (LIPITOR) 20 MG tablet, TAKE 1 TABLET(20 MG) BY MOUTH DAILY, Disp: 30 tablet, Rfl: 0   buPROPion (WELLBUTRIN XL) 150 MG 24 hr tablet, Take 1 tablet (150 mg total) by mouth daily., Disp: 90 tablet, Rfl: 1   Ferrous Gluconate (FE-40 PO), Take by mouth., Disp: , Rfl:    hydrOXYzine (ATARAX) 25 MG tablet, Take 0.5 tablets (12.5 mg total) by mouth 3 (three) times daily as needed., Disp: 30 tablet, Rfl: 0   Semaglutide-Weight Management (WEGOVY) 0.25 MG/0.5ML SOAJ, Inject 0.25 mg into the skin once a week., Disp: 2 mL, Rfl: 0   traMADol (ULTRAM) 50 MG tablet, Take 1 tablet (50 mg total) by mouth every 6 (six) hours as needed (pain)., Disp: 12  tablet, Rfl: 0   valACYclovir (VALTREX) 500 MG tablet, Take 1 tablet (500 mg total) by mouth daily., Disp: 90 tablet, Rfl: 1   vitamin B-12 (CYANOCOBALAMIN) 500 MCG tablet, Take 500 mcg by mouth daily., Disp: , Rfl:    amLODipine (NORVASC) 5 MG tablet, Take 1 tablet (5 mg total) by mouth daily., Disp: 90 tablet, Rfl: 1   hydrochlorothiazide (HYDRODIURIL) 25 MG tablet, Take 1 tablet (25 mg total) by mouth daily., Disp: 90 tablet, Rfl: 1   olmesartan (BENICAR) 20 MG tablet, Take 1 tablet (20 mg total) by mouth daily., Disp: 90 tablet, Rfl: 1  Review of Systems:  Negative unless indicated in HPI.   Physical Exam: Vitals:   12/31/22 0741  BP: 130/86  Pulse: 69  Temp: 98.3 F (36.8 C)  TempSrc: Oral  SpO2: 97%  Weight: 233 lb 6.4 oz (105.9 kg)    Body mass index is 35.49 kg/m.   Physical Exam Vitals reviewed.  Constitutional:      Appearance: Normal appearance.  HENT:     Head: Normocephalic and atraumatic.  Eyes:     Conjunctiva/sclera: Conjunctivae normal.     Pupils: Pupils are equal, round, and reactive to light.  Cardiovascular:     Rate and Rhythm: Normal rate and regular rhythm.  Pulmonary:     Effort: Pulmonary effort is normal.     Breath sounds: Normal breath sounds.  Skin:    General: Skin is  warm and dry.  Neurological:     General: No focal deficit present.     Mental Status: She is alert and oriented to person, place, and time.  Psychiatric:        Mood and Affect: Mood normal.        Behavior: Behavior normal.        Thought Content: Thought content normal.        Judgment: Judgment normal.      Impression and Plan:  IGT (impaired glucose tolerance) Assessment & Plan: A1c has increased to 6.2.  We have discussed importance of lifestyle changes.  Orders: -     POCT glycosylated hemoglobin (Hb A1C) -     Wegovy; Inject 0.25 mg into the skin once a week.  Dispense: 2 mL; Refill: 0  Mixed hyperlipidemia Assessment & Plan: Check fasting lipids  when she returns for CPE next month.   Primary hypertension Assessment & Plan: Blood pressure is borderline, she has been off medications for a few days, closer to a week.  Refill.  Orders: -     amLODIPine Besylate; Take 1 tablet (5 mg total) by mouth daily.  Dispense: 90 tablet; Refill: 1 -     hydroCHLOROthiazide; Take 1 tablet (25 mg total) by mouth daily.  Dispense: 90 tablet; Refill: 1 -     Olmesartan Medoxomil; Take 1 tablet (20 mg total) by mouth daily.  Dispense: 90 tablet; Refill: 1  Morbid obesity (HCC) Assessment & Plan: -Discussed healthy lifestyle, including increased physical activity and better food choices to promote weight loss. -After discussion she has elected to see if her insurance will cover [facility].  Orders: -     Wegovy; Inject 0.25 mg into the skin once a week.  Dispense: 2 mL; Refill: 0  Stage 3a chronic kidney disease (HCC) Assessment & Plan: Check renal function next visit to ensure stability.   Immunization due  -Flu vaccine administered in office today   Time spent:36 minutes reviewing chart, interviewing and examining patient and formulating plan of care.     Chaya Jan, MD [facility]

## 2022-12-31 NOTE — Addendum Note (Signed)
Addended by: Kern Reap B on: 12/31/2022 11:33 AM   Modules accepted: Orders

## 2022-12-31 NOTE — Assessment & Plan Note (Signed)
Blood pressure is borderline, she has been off medications for a few days, closer to a week.  Refill.

## 2022-12-31 NOTE — Assessment & Plan Note (Signed)
Check renal function next visit to ensure stability.

## 2022-12-31 NOTE — Assessment & Plan Note (Signed)
A1c has increased to 6.2.  We have discussed importance of lifestyle changes.

## 2023-01-01 MED ORDER — ATORVASTATIN CALCIUM 20 MG PO TABS: 20.0000 mg | ORAL_TABLET | Freq: Every day | ORAL | 1 refills | Status: DC

## 2023-01-07 ENCOUNTER — Telehealth: Payer: Self-pay | Admitting: Pharmacist

## 2023-01-07 NOTE — Telephone Encounter (Signed)
Pharmacy Patient Advocate Encounter   Received notification from Physician's Office that prior authorization for [facility] 0.25MG /0.5ML auto-injectors is required/requested.   Insurance verification completed.   The patient is insured through [employer]  .   Per test claim: PA required; PA submitted to [employer] via CoverMyMeds Key/confirmation #/EOC [id number] Status is pending

## 2023-01-08 ENCOUNTER — Other Ambulatory Visit (HOSPITAL_COMMUNITY): Payer: Self-pay

## 2023-01-08 NOTE — Telephone Encounter (Signed)
Pharmacy Patient Advocate Encounter  Received notification from [employer]  that Prior Authorization for Wegovy 0.25MG /0.5ML auto-injectors has been APPROVED from 01/07/2023 to 07/22/2023. Ran test claim, Copay is $30.00. This test claim was processed through [facility]- copay amounts may vary at other pharmacies due to pharmacy/plan contracts, or as the patient moves through the different stages of their insurance plan.   PA #/Case ID/Reference #:  [id number]

## 2023-01-14 DIAGNOSIS — F4312 Post-traumatic stress disorder, chronic: Secondary | ICD-10-CM | POA: Diagnosis not present

## 2023-01-28 DIAGNOSIS — F4312 Post-traumatic stress disorder, chronic: Secondary | ICD-10-CM | POA: Diagnosis not present

## 2023-02-01 ENCOUNTER — Emergency Department (HOSPITAL_COMMUNITY): Payer: BC Managed Care – PPO

## 2023-02-01 ENCOUNTER — Emergency Department (HOSPITAL_COMMUNITY)
Admission: EM | Admit: 2023-02-01 | Discharge: 2023-02-02 | Disposition: A | Discharge status: Home / Self Care | Payer: BC Managed Care – PPO | Attending: Emergency Medicine | Admitting: Emergency Medicine

## 2023-02-01 ENCOUNTER — Encounter (HOSPITAL_COMMUNITY): Payer: Self-pay

## 2023-02-01 DIAGNOSIS — R200 Anesthesia of skin: Secondary | ICD-10-CM | POA: Diagnosis not present

## 2023-02-01 DIAGNOSIS — G9389 Other specified disorders of brain: Secondary | ICD-10-CM | POA: Diagnosis not present

## 2023-02-01 DIAGNOSIS — R202 Paresthesia of skin: Secondary | ICD-10-CM | POA: Diagnosis not present

## 2023-02-01 DIAGNOSIS — R29818 Other symptoms and signs involving the nervous system: Secondary | ICD-10-CM | POA: Diagnosis not present

## 2023-02-01 HISTORY — DX: Obesity, unspecified: E66.9

## 2023-02-01 HISTORY — DX: Hyperlipidemia, unspecified: E78.5

## 2023-02-01 HISTORY — DX: Anxiety disorder, unspecified: F41.9

## 2023-02-01 HISTORY — DX: Disorder of kidney and ureter, unspecified: N28.9

## 2023-02-01 LAB — BASIC METABOLIC PANEL
Anion gap: 10 (ref 5–15)
BUN: 12 mg/dL (ref 6–20)
CO2: 24 mmol/L (ref 22–32)
Calcium: 9.6 mg/dL (ref 8.9–10.3)
Chloride: 104 mmol/L (ref 98–111)
Creatinine, Ser: 0.9 mg/dL (ref 0.44–1.00)
GFR, Estimated: >60 mL/min (ref >60)
Glucose, Bld: 98 mg/dL (ref 70–99)
Potassium: 3.2 mmol/L — ABNORMAL LOW (ref 3.5–5.1)
Sodium: 138 mmol/L (ref 135–145)

## 2023-02-01 LAB — CBC
HCT: 35 % — ABNORMAL LOW (ref 36.0–46.0)
Hemoglobin: 11.1 g/dL — ABNORMAL LOW (ref 12.0–15.0)
MCH: 26.8 pg (ref 26.0–34.0)
MCHC: 31.7 g/dL (ref 30.0–36.0)
MCV: 84.5 fL (ref 80.0–100.0)
Platelets: 368 10*3/uL (ref 150–400)
RBC: 4.14 MIL/uL (ref 3.87–5.11)
RDW: 13.5 % (ref 11.5–15.5)
WBC: 10.9 10*3/uL — ABNORMAL HIGH (ref 4.0–10.5)
nRBC: 0 % (ref 0.0–0.2)

## 2023-02-01 NOTE — ED Triage Notes (Signed)
Pt is coming in for numbness that started yesterday, she mentions that it started in her right leg and it is almost like the leg is asleep feeling. She mentions the reason for coming in today is that on her way home from work the right side of her face started to feel numb and tingling, almost like she had an injection in her mouth. She went home and laid down and there was some improvement in the numbness but it is still present at this time. Neurological assessment was done with no arm or leg drift, no facial droop, and equal grip bilaterally.

## 2023-02-01 NOTE — ED Provider Triage Note (Signed)
Emergency Medicine Provider Triage Evaluation Note  Rhonda Harrison , a 57 y.o. female  was evaluated in triage.  Pt complains of numbness.  States she started having numbness in her right upper lateral thigh yesterday.  Also reporting some numbness in her right cheek started around 4 PM today.  Denies chest pain or arrhythmia.  Denies headache or visual disturbance.  Review of Systems  Positive: See above Negative: See above  Physical Exam  BP (!) 124/97 (BP Location: Right Arm)   Pulse 75   Temp 97.7 F (36.5 C)   Resp 16   SpO2 100%  Gen:   Awake, no distress   Resp:  Normal effort  MSK:   Moves extremities without difficulty  Other:  No FND  Medical Decision Making  Medically screening exam initiated at 8:23 PM.  Appropriate orders placed.  Rhonda Harrison was informed that the remainder of the evaluation will be completed by another provider, this initial triage assessment does not replace that evaluation, and the importance of remaining in the ED until their evaluation is complete.  Work up started   Rhonda Harrison, [place] 02/01/23 2024

## 2023-02-02 ENCOUNTER — Emergency Department (HOSPITAL_COMMUNITY): Payer: BC Managed Care – PPO

## 2023-02-02 ENCOUNTER — Other Ambulatory Visit (HOSPITAL_COMMUNITY): Payer: BC Managed Care – PPO

## 2023-02-02 ENCOUNTER — Encounter (HOSPITAL_COMMUNITY): Payer: Self-pay

## 2023-02-02 ENCOUNTER — Other Ambulatory Visit: Payer: Self-pay

## 2023-02-02 DIAGNOSIS — R29818 Other symptoms and signs involving the nervous system: Secondary | ICD-10-CM | POA: Diagnosis not present

## 2023-02-02 DIAGNOSIS — R202 Paresthesia of skin: Secondary | ICD-10-CM | POA: Diagnosis not present

## 2023-02-02 DIAGNOSIS — R200 Anesthesia of skin: Secondary | ICD-10-CM | POA: Diagnosis not present

## 2023-02-02 MED ORDER — POTASSIUM CHLORIDE CRYS ER 20 MEQ PO TBCR
40.0000 meq | EXTENDED_RELEASE_TABLET | Freq: Once | ORAL | Status: AC
  Administered 2023-02-02: 40 meq via ORAL
  Filled 2023-02-02: qty 2

## 2023-02-02 MED ORDER — LORAZEPAM 2 MG/ML IJ SOLN
1.0000 mg | Freq: Once | INTRAMUSCULAR | Status: AC | PRN
  Administered 2023-02-02: 1 mg via INTRAVENOUS
  Filled 2023-02-02: qty 1

## 2023-02-02 NOTE — ED Provider Notes (Signed)
[facility]EMERGENCY DEPT [facility] Emergency Department Provider Note MRN:  [id number]  Arrival date & time: 02/02/23     Chief Complaint   Numbness   History of Present Illness   Rhonda Harrison is a 57 y.o. year-old female presents to the ED with chief complaint of numbness and tingling.  She states that she first noticed the symptoms yesterday in her car and it was only affecting her right leg.  She denies having had any associated weakness.  She states that today she began noticing the numbness sensation on the right side of her face.  She denies speech or vision changes.  Denies any weakness.  States that she still has the numbness sensation, but it is very mild.  History provided by patient.   Review of Systems  Pertinent positive and negative review of systems noted in HPI.    Physical Exam   Vitals:   02/01/23 2016 02/02/23 0036  BP: (!) 124/97 121/77  Pulse: 75 65  Resp: 16 20  Temp: 97.7 F (36.5 C) 97.8 F (36.6 C)  SpO2: 100% 100%    CONSTITUTIONAL:  well-appearing, NAD NEURO:  Alert and oriented x 3, CN 3-12 grossly intact, no pronator drift, normal finger to nose and heel to shin EYES:  eyes equal and reactive ENT/NECK:  Supple, no stridor  CARDIO:  normal rate, appears well-perfused  PULM:  No respiratory distress,  GI/GU:  non-distended,  MSK/SPINE:  No gross deformities, no edema, moves all extremities  SKIN:  no rash, atraumatic   *Additional and/or pertinent findings included in MDM below  Diagnostic and Interventional Summary    EKG Interpretation Date/Time:    Ventricular Rate:    PR Interval:    QRS Duration:    QT Interval:    QTC Calculation:   R Axis:      Text Interpretation:         Labs Reviewed  CBC - Abnormal; Notable for the following components:      Result Value   WBC 10.9 (*)    Hemoglobin 11.1 (*)    HCT 35.0 (*)    All other components within normal limits  BASIC METABOLIC PANEL - Abnormal; Notable for the  following components:   Potassium 3.2 (*)    All other components within normal limits    CT Head Wo Contrast  Final Result    MR BRAIN WO CONTRAST    (Results Pending)    Medications  potassium chloride SA (KLOR-CON M) CR tablet 40 mEq (has no administration in time range)     Procedures  /  Critical Care Procedures  ED Course and Medical Decision Making  I have reviewed the triage vital signs, the nursing notes, and pertinent available records from the EMR.  Social Determinants Affecting Complexity of Care: Patient has no clinically significant social determinants affecting this chief complaint..   ED Course:    Medical Decision Making Patient here with numbness sensation to right face and right leg.  Onset was 2 days ago in the leg and 1 day ago in the face.  Denies any weakness. No vision or speech changes.  CT head ordered in triage shows no stroke.  Will check MRI.  If negative, anticipate discharge.    Amount and/or Complexity of Data Reviewed Radiology: ordered.  Risk Prescription drug management.         Consultants:    Treatment and Plan: Patient signed out to oncoming team.  Dispo pending MRI.  Final Clinical Impressions(s) / ED Diagnoses  No diagnosis found.  ED Discharge Orders     None         Discharge Instructions Discussed with and Provided to Patient:   Discharge Instructions   None      Roxy Horseman, Cordelia Poche 02/02/23 1610    Tilden Fossa, MD 02/02/23 [phone]

## 2023-02-02 NOTE — ED Provider Notes (Signed)
  Physical Exam  BP (!) 125/90   Pulse 63   Temp 97.6 F (36.4 C) (Oral)   Resp 18   SpO2 100%   Physical Exam Vitals and nursing note reviewed.  Constitutional:      General: She is not in acute distress.    Appearance: She is well-developed.  HENT:     Head: Normocephalic and atraumatic.  Eyes:     Conjunctiva/sclera: Conjunctivae normal.  Cardiovascular:     Rate and Rhythm: Normal rate and regular rhythm.     Heart sounds: No murmur heard. Pulmonary:     Effort: Pulmonary effort is normal. No respiratory distress.     Breath sounds: Normal breath sounds.  Abdominal:     Palpations: Abdomen is soft.     Tenderness: There is no abdominal tenderness.  Musculoskeletal:        General: No swelling.     Cervical back: Neck supple.  Skin:    General: Skin is warm and dry.     Capillary Refill: Capillary refill takes less than 2 seconds.  Neurological:     Mental Status: She is alert.     Comments: Intact finger-nose, heel-to-shin.  No pronator drift, slurred speech, no facial droop.  Psychiatric:        Mood and Affect: Mood normal.     Procedures  Procedures  ED Course / MDM   Clinical Course as of 02/02/23 1116  Sat Feb 02, 2023  7829 Very sweet woman. Needs MRI to rule out stroke. Numbness sensation to right side of face and right leg that began 2 days ago. If MRI negative, she can go home and follow up with neuro. Needs neuro referral. Previous provider thinks MRI will be positive.  [CG]    Clinical Course User Index [CG] Al Decant, PA-C   Medical Decision Making Amount and/or Complexity of Data Reviewed Radiology: ordered.  Risk Prescription drug management.   57 year old female presents to ED for evaluation.  Please see HPI for further details.  Please see previous provider note for further details.  Handed off from [facility]  Plan was to follow-up on the results of MRI.  Patient here with subjective right-sided facial numbness, right  leg numbness x 2 days.  Mild in nature she states.  No vision changes, weakness.  Patient MRI negative.  Does show possibly empty sella turcica however patient exam reassuring.  She will follow-up with neurology as an outpatient per plan of previous provider.  She is stable to discharge home.       Al Decant, PA-C 02/02/23 1116    Linwood Dibbles, MD 02/02/23 [phone]

## 2023-02-02 NOTE — Discharge Instructions (Addendum)
It was a pleasure taking part in your care.  Please follow-up with neurology as an outpatient.  Please return with any new or worsening symptoms such as weakness, facial droop, slurred speech or other concerning symptoms.

## 2023-02-04 ENCOUNTER — Encounter: Payer: Self-pay | Admitting: Internal Medicine

## 2023-02-05 ENCOUNTER — Ambulatory Visit: Payer: BC Managed Care – PPO | Admitting: Internal Medicine

## 2023-02-12 ENCOUNTER — Telehealth: Payer: Self-pay | Admitting: *Deleted

## 2023-02-12 NOTE — Progress Notes (Unsigned)
  Care Coordination  Outreach Note  02/12/2023 Name: Rhonda Harrison MRN: [id number] DOB: 04/13/1965   Care Coordination Outreach Attempts: An unsuccessful telephone outreach was attempted today to offer the patient information about available care coordination services.  Follow Up Plan:  Additional outreach attempts will be made to offer the patient care coordination information and services.   Encounter Outcome:  No Answer  Burman Nieves, CCMA Care Coordination Care Guide Direct Dial: [phone]

## 2023-02-13 ENCOUNTER — Ambulatory Visit: Payer: BC Managed Care – PPO | Admitting: Internal Medicine

## 2023-02-13 NOTE — Progress Notes (Signed)
  Care Coordination   Note   02/13/2023 Name: Rhonda Harrison MRN: [id number] DOB: 02/02/1966  [name] is a 57 y.o. year old female who sees Philip Aspen, Limmie Patricia, MD for primary care. I reached out to Reita Cliche by phone today to offer care coordination services.  Rhonda Harrison was given information about Care Coordination services today including:   The Care Coordination services include support from the care team which includes your Nurse Coordinator, Clinical Social Worker, or Pharmacist.  The Care Coordination team is here to help remove barriers to the health concerns and goals most important to you. Care Coordination services are voluntary, and the patient may decline or stop services at any time by request to their care team member.   Care Coordination Consent Status: Patient did not agree to participate in care coordination services at this time.   Encounter Outcome:  Patient Refused  Burman Nieves, [facility] Care Coordination Care Guide Direct Dial: [phone]

## 2023-02-28 DIAGNOSIS — F4312 Post-traumatic stress disorder, chronic: Secondary | ICD-10-CM | POA: Diagnosis not present

## 2023-03-11 ENCOUNTER — Encounter: Payer: BC Managed Care – PPO | Admitting: Internal Medicine

## 2023-03-11 DIAGNOSIS — I10 Essential (primary) hypertension: Secondary | ICD-10-CM

## 2023-03-11 DIAGNOSIS — R7302 Impaired glucose tolerance (oral): Secondary | ICD-10-CM

## 2023-03-11 DIAGNOSIS — E559 Vitamin D deficiency, unspecified: Secondary | ICD-10-CM

## 2023-03-11 DIAGNOSIS — E782 Mixed hyperlipidemia: Secondary | ICD-10-CM

## 2023-03-12 DIAGNOSIS — F4312 Post-traumatic stress disorder, chronic: Secondary | ICD-10-CM | POA: Diagnosis not present

## 2023-03-14 ENCOUNTER — Encounter: Payer: Self-pay | Admitting: Internal Medicine

## 2023-03-14 ENCOUNTER — Ambulatory Visit: Payer: BC Managed Care – PPO | Admitting: Internal Medicine

## 2023-03-14 ENCOUNTER — Other Ambulatory Visit: Payer: BC Managed Care – PPO

## 2023-03-14 VITALS — BP 102/74 | HR 71 | Temp 97.9°F | Ht 68.0 in | Wt 229.7 lb

## 2023-03-14 DIAGNOSIS — R7302 Impaired glucose tolerance (oral): Secondary | ICD-10-CM

## 2023-03-14 DIAGNOSIS — E66811 Obesity, class 1: Secondary | ICD-10-CM

## 2023-03-14 DIAGNOSIS — E559 Vitamin D deficiency, unspecified: Secondary | ICD-10-CM

## 2023-03-14 DIAGNOSIS — E669 Obesity, unspecified: Secondary | ICD-10-CM

## 2023-03-14 DIAGNOSIS — Z0100 Encounter for examination of eyes and vision without abnormal findings: Secondary | ICD-10-CM

## 2023-03-14 DIAGNOSIS — R7303 Prediabetes: Secondary | ICD-10-CM | POA: Diagnosis not present

## 2023-03-14 DIAGNOSIS — E782 Mixed hyperlipidemia: Secondary | ICD-10-CM

## 2023-03-14 DIAGNOSIS — I10 Essential (primary) hypertension: Secondary | ICD-10-CM

## 2023-03-14 DIAGNOSIS — F411 Generalized anxiety disorder: Secondary | ICD-10-CM

## 2023-03-14 DIAGNOSIS — Z1211 Encounter for screening for malignant neoplasm of colon: Secondary | ICD-10-CM

## 2023-03-14 DIAGNOSIS — Z0000 Encounter for general adult medical examination without abnormal findings: Secondary | ICD-10-CM | POA: Diagnosis not present

## 2023-03-14 DIAGNOSIS — Z1231 Encounter for screening mammogram for malignant neoplasm of breast: Secondary | ICD-10-CM

## 2023-03-14 DIAGNOSIS — N1831 Chronic kidney disease, stage 3a: Secondary | ICD-10-CM

## 2023-03-14 LAB — CBC WITH DIFFERENTIAL/PLATELET
Basophils Absolute: 0.1 10*3/uL (ref 0.0–0.1)
Basophils Relative: 0.8 % (ref 0.0–3.0)
Eosinophils Absolute: 0.4 10*3/uL (ref 0.0–0.7)
Eosinophils Relative: 4.2 % (ref 0.0–5.0)
HCT: 35 % — ABNORMAL LOW (ref 36.0–46.0)
Hemoglobin: 11.2 g/dL — ABNORMAL LOW (ref 12.0–15.0)
Lymphocytes Relative: 30.1 % (ref 12.0–46.0)
Lymphs Abs: 2.7 10*3/uL (ref 0.7–4.0)
MCHC: 31.9 g/dL (ref 30.0–36.0)
MCV: 85.7 fL (ref 78.0–100.0)
Monocytes Absolute: 0.8 10*3/uL (ref 0.1–1.0)
Monocytes Relative: 9.4 % (ref 3.0–12.0)
Neutro Abs: 5 10*3/uL (ref 1.4–7.7)
Neutrophils Relative %: 55.5 % (ref 43.0–77.0)
Platelets: 326 10*3/uL (ref 150.0–400.0)
RBC: 4.09 Mil/uL (ref 3.87–5.11)
RDW: 13.7 % (ref 11.5–15.5)
WBC: 9 10*3/uL (ref 4.0–10.5)

## 2023-03-14 LAB — VITAMIN B12: Vitamin B-12: 723 pg/mL (ref 211–911)

## 2023-03-14 LAB — VITAMIN D 25 HYDROXY (VIT D DEFICIENCY, FRACTURES): VITD: 21.44 ng/mL — ABNORMAL LOW (ref 30.00–100.00)

## 2023-03-14 LAB — COMPREHENSIVE METABOLIC PANEL
ALT: 12 U/L (ref 0–35)
AST: 11 U/L (ref 0–37)
Albumin: 4.1 g/dL (ref 3.5–5.2)
Alkaline Phosphatase: 72 U/L (ref 39–117)
BUN: 12 mg/dL (ref 6–23)
CO2: 27 meq/L (ref 19–32)
Calcium: 9.3 mg/dL (ref 8.4–10.5)
Chloride: 102 meq/L (ref 96–112)
Creatinine, Ser: 0.88 mg/dL (ref 0.40–1.20)
GFR: 73.61 mL/min (ref >60.00)
Glucose, Bld: 109 mg/dL — ABNORMAL HIGH (ref 70–99)
Potassium: 3.6 meq/L (ref 3.5–5.1)
Sodium: 139 meq/L (ref 135–145)
Total Bilirubin: 0.7 mg/dL (ref 0.2–1.2)
Total Protein: 7.5 g/dL (ref 6.0–8.3)

## 2023-03-14 LAB — LIPID PANEL
Cholesterol: 142 mg/dL (ref 0–200)
HDL: 43.9 mg/dL (ref >39.00)
LDL Cholesterol: 87 mg/dL (ref 0–99)
NonHDL: 97.74
Total CHOL/HDL Ratio: 3
Triglycerides: 56 mg/dL (ref 0.0–149.0)
VLDL: 11.2 mg/dL (ref 0.0–40.0)

## 2023-03-14 LAB — HEMOGLOBIN A1C: Hgb A1c MFr Bld: 6.3 % (ref 4.6–6.5)

## 2023-03-14 LAB — TSH: TSH: 2.71 u[IU]/mL (ref 0.35–5.50)

## 2023-03-14 NOTE — Progress Notes (Signed)
 Established Patient Office Visit     CC/Reason for Visit: Annual preventive exam  HPI: Rhonda Harrison is a 58 y.o. female who is coming in today for the above mentioned reasons. Past Medical History is significant for: Hypertension, hyperlipidemia, impaired glucose tolerance, chronic kidney disease stage III, vitamin D  deficiency, obesity, PTSD.  She is due for COVID vaccination.  She had a colonoscopy when she turned 51 and was advised 3-year follow-up but has not yet done this.  Her previous colonoscopy was in [place] .  She is overdue for mammogram.   Past Medical/Surgical History: Past Medical History:  Diagnosis Date   Anxiety    Chronic kidney disease    GAD (generalized anxiety disorder)    Genital HSV    Hyperlipidemia    Hypertension    Kidney disease    Obesity    Obesity (BMI 30.0-34.9)     History reviewed. No pertinent surgical history.  Social History:  reports that she has never smoked. She has never used smokeless tobacco. She reports that she does not currently use alcohol. She reports that she does not currently use drugs.  Allergies: No Known Allergies  Family History:  History reviewed. No pertinent family history.   Current Outpatient Medications:    amLODipine  (NORVASC ) 5 MG tablet, Take 1 tablet (5 mg total) by mouth daily., Disp: 90 tablet, Rfl: 1   Ascorbic Acid (VITAMIN C) 100 MG tablet, Take 100 mg by mouth daily., Disp: , Rfl:    atorvastatin  (LIPITOR) 20 MG tablet, Take 1 tablet (20 mg total) by mouth at bedtime., Disp: 90 tablet, Rfl: 1   buPROPion  (WELLBUTRIN  XL) 150 MG 24 hr tablet, Take 1 tablet (150 mg total) by mouth daily., Disp: 90 tablet, Rfl: 1   Ferrous Gluconate (FE-40 PO), Take by mouth., Disp: , Rfl:    hydrochlorothiazide  (HYDRODIURIL ) 25 MG tablet, Take 1 tablet (25 mg total) by mouth daily., Disp: 90 tablet, Rfl: 1   hydrOXYzine  (ATARAX ) 25 MG tablet, Take 0.5 tablets (12.5 mg total) by mouth 3 (three) times daily  as needed., Disp: 30 tablet, Rfl: 0   olmesartan  (BENICAR ) 20 MG tablet, Take 1 tablet (20 mg total) by mouth daily., Disp: 90 tablet, Rfl: 1   traMADol  (ULTRAM ) 50 MG tablet, Take 1 tablet (50 mg total) by mouth every 6 (six) hours as needed (pain)., Disp: 12 tablet, Rfl: 0   valACYclovir  (VALTREX ) 500 MG tablet, Take 1 tablet (500 mg total) by mouth daily., Disp: 90 tablet, Rfl: 1   vitamin B-12 (CYANOCOBALAMIN ) 500 MCG tablet, Take 500 mcg by mouth daily., Disp: , Rfl:    Semaglutide -Weight Management (WEGOVY ) 0.25 MG/0.5ML SOAJ, Inject 0.25 mg into the skin once a week. (Patient not taking: Reported on 03/14/2023), Disp: 2 mL, Rfl: 0  Review of Systems:  Negative unless indicated in HPI.   Physical Exam: Vitals:   03/14/23 0750  BP: 102/74  Pulse: 71  Temp: 97.9 F (36.6 C)  TempSrc: Oral  SpO2: 97%  Weight: 229 lb 11.2 oz (104.2 kg)  Height: 5' 8 (1.727 m)    Body mass index is 34.93 kg/m.   Physical Exam Vitals reviewed.  Constitutional:      General: She is not in acute distress.    Appearance: Normal appearance. She is not ill-appearing, toxic-appearing or diaphoretic.  HENT:     Head: Normocephalic.     Right Ear: Tympanic membrane, ear canal and external ear normal. There is no impacted  cerumen.     Left Ear: Tympanic membrane, ear canal and external ear normal. There is no impacted cerumen.     Nose: Nose normal.     Mouth/Throat:     Mouth: Mucous membranes are moist.     Pharynx: Oropharynx is clear. No oropharyngeal exudate or posterior oropharyngeal erythema.  Eyes:     General: No scleral icterus.       Right eye: No discharge.        Left eye: No discharge.     Conjunctiva/sclera: Conjunctivae normal.     Pupils: Pupils are equal, round, and reactive to light.  Neck:     Vascular: No carotid bruit.  Cardiovascular:     Rate and Rhythm: Normal rate and regular rhythm.     Pulses: Normal pulses.     Heart sounds: Normal heart sounds.  Pulmonary:      Effort: Pulmonary effort is normal. No respiratory distress.     Breath sounds: Normal breath sounds.  Abdominal:     General: Abdomen is flat. Bowel sounds are normal.     Palpations: Abdomen is soft.  Musculoskeletal:        General: Normal range of motion.     Cervical back: Normal range of motion.  Skin:    General: Skin is warm and dry.  Neurological:     General: No focal deficit present.     Mental Status: She is alert and oriented to person, place, and time. Mental status is at baseline.  Psychiatric:        Mood and Affect: Mood normal.        Behavior: Behavior normal.        Thought Content: Thought content normal.        Judgment: Judgment normal.     Flowsheet Row Office Visit from 03/14/2023 in [facility] [place]  PHQ-9 Total Score 10        Impression and Plan:  Well adult exam  Mixed hyperlipidemia -     Lipid panel; Future  Primary hypertension -     CBC with Differential/Platelet; Future -     Comprehensive metabolic panel; Future  Prediabetes  Stage 3a chronic kidney disease (HCC)  Vitamin D  deficiency  Obesity (BMI 30.0-34.9) -     TSH; Future -     Vitamin B12; Future -     VITAMIN D  25 Hydroxy (Vit-D Deficiency, Fractures); Future  IGT (impaired glucose tolerance) -     Hemoglobin A1c; Future  GAD (generalized anxiety disorder)  Screening for colon cancer -     Ambulatory referral to Gastroenterology  Encounter for vision screening -     Ambulatory referral to Ophthalmology  Screening mammogram for breast cancer -     3D Screening Mammogram, Left and Right; Future   -Recommend routine eye and dental care. -Healthy lifestyle discussed in detail. -Labs to be updated today. -Prostate cancer screening: N/A Health Maintenance  Topic Date Due   Colon Cancer Screening  Never done   COVID-19 Vaccine (4 - 2024-25 season) 03/30/2023*   Hepatitis C Screening  03/13/2024*   HIV Screening  03/13/2024*    Mammogram  07/11/2023   DTaP/Tdap/Td vaccine (2 - Td or Tdap) 06/27/2025   Pap with HPV screening  08/15/2026   Flu Shot  Completed   Zoster (Shingles) Vaccine  Completed   HPV Vaccine  Aged Out  *Topic was postponed. The date shown is not the original due date.    -  Advised to update COVID-vaccine at pharmacy. -Referrals placed to GI and ophthalmology.    [name], MD [facility] at [facility]

## 2023-03-18 ENCOUNTER — Other Ambulatory Visit: Payer: Self-pay | Admitting: Internal Medicine

## 2023-03-18 DIAGNOSIS — E559 Vitamin D deficiency, unspecified: Secondary | ICD-10-CM

## 2023-03-18 MED ORDER — VITAMIN D (ERGOCALCIFEROL) 1.25 MG (50000 UNIT) PO CAPS
50000.0000 [IU] | ORAL_CAPSULE | ORAL | 0 refills | Status: AC
Start: 2023-03-18 — End: 2023-06-04

## 2023-03-20 ENCOUNTER — Other Ambulatory Visit: Payer: Self-pay | Admitting: Internal Medicine

## 2023-03-20 DIAGNOSIS — R7302 Impaired glucose tolerance (oral): Secondary | ICD-10-CM

## 2023-03-20 MED ORDER — WEGOVY 0.25 MG/0.5ML ~~LOC~~ SOAJ
0.2500 mg | SUBCUTANEOUS | 0 refills | Status: DC
Start: 2023-03-20 — End: 2023-04-15

## 2023-03-21 ENCOUNTER — Ambulatory Visit
Admission: RE | Admit: 2023-03-21 | Discharge: 2023-03-21 | Disposition: A | Discharge status: Home / Self Care | Payer: BC Managed Care – PPO | Source: Ambulatory Visit | Attending: Internal Medicine | Admitting: Internal Medicine

## 2023-03-21 DIAGNOSIS — Z1231 Encounter for screening mammogram for malignant neoplasm of breast: Secondary | ICD-10-CM

## 2023-03-25 DIAGNOSIS — F4312 Post-traumatic stress disorder, chronic: Secondary | ICD-10-CM | POA: Diagnosis not present

## 2023-04-04 ENCOUNTER — Other Ambulatory Visit: Payer: Self-pay | Admitting: Internal Medicine

## 2023-04-04 DIAGNOSIS — I10 Essential (primary) hypertension: Secondary | ICD-10-CM

## 2023-04-14 ENCOUNTER — Encounter: Payer: Self-pay | Admitting: Internal Medicine

## 2023-04-15 ENCOUNTER — Other Ambulatory Visit: Payer: Self-pay | Admitting: Internal Medicine

## 2023-04-15 MED ORDER — WEGOVY 0.5 MG/0.5ML ~~LOC~~ SOAJ
0.5000 mg | SUBCUTANEOUS | 0 refills | Status: DC
Start: 2023-04-15 — End: 2023-05-11

## 2023-04-25 DIAGNOSIS — F4312 Post-traumatic stress disorder, chronic: Secondary | ICD-10-CM | POA: Diagnosis not present

## 2023-05-09 ENCOUNTER — Encounter: Payer: Self-pay | Admitting: Internal Medicine

## 2023-05-09 DIAGNOSIS — F4312 Post-traumatic stress disorder, chronic: Secondary | ICD-10-CM | POA: Diagnosis not present

## 2023-05-11 ENCOUNTER — Other Ambulatory Visit: Payer: Self-pay | Admitting: Internal Medicine

## 2023-05-14 MED ORDER — WEGOVY 0.5 MG/0.5ML ~~LOC~~ SOAJ: 0.5000 mg | SUBCUTANEOUS | 0 refills | Status: DC

## 2023-05-14 NOTE — Telephone Encounter (Signed)
 Spoke to patient and she would like to stay on the same dosage.

## 2023-05-22 DIAGNOSIS — F4312 Post-traumatic stress disorder, chronic: Secondary | ICD-10-CM | POA: Diagnosis not present

## 2023-06-15 ENCOUNTER — Other Ambulatory Visit: Payer: Self-pay | Admitting: Internal Medicine

## 2023-07-23 ENCOUNTER — Ambulatory Visit: Admitting: Internal Medicine

## 2023-07-23 ENCOUNTER — Telehealth: Admitting: Internal Medicine

## 2023-07-23 ENCOUNTER — Telehealth: Payer: Self-pay

## 2023-07-23 ENCOUNTER — Other Ambulatory Visit (HOSPITAL_COMMUNITY): Payer: Self-pay

## 2023-07-23 ENCOUNTER — Encounter: Payer: Self-pay | Admitting: Internal Medicine

## 2023-07-23 VITALS — BP 110/80 | HR 71 | Temp 98.4°F | Wt 218.9 lb

## 2023-07-23 DIAGNOSIS — I10 Essential (primary) hypertension: Secondary | ICD-10-CM | POA: Diagnosis not present

## 2023-07-23 DIAGNOSIS — R7302 Impaired glucose tolerance (oral): Secondary | ICD-10-CM

## 2023-07-23 DIAGNOSIS — F411 Generalized anxiety disorder: Secondary | ICD-10-CM

## 2023-07-23 DIAGNOSIS — K219 Gastro-esophageal reflux disease without esophagitis: Secondary | ICD-10-CM | POA: Diagnosis not present

## 2023-07-23 MED ORDER — WEGOVY 1 MG/0.5ML ~~LOC~~ SOAJ
1.0000 mg | SUBCUTANEOUS | 0 refills | Status: DC
Start: 2023-07-23 — End: 2023-10-01

## 2023-07-23 MED ORDER — BUPROPION HCL ER (XL) 150 MG PO TB24: 150.0000 mg | ORAL_TABLET | Freq: Every day | ORAL | 1 refills | Status: DC

## 2023-07-23 MED ORDER — PANTOPRAZOLE SODIUM 40 MG PO TBEC: 40.0000 mg | DELAYED_RELEASE_TABLET | Freq: Every day | ORAL | 1 refills | Status: DC

## 2023-07-23 NOTE — Progress Notes (Signed)
 Established Patient Office Visit     CC/Reason for Visit: Follow-up chronic conditions, chest burning  HPI: Rhonda Harrison is a 58 y.o. female who is coming in today for the above mentioned reasons. Past Medical History is significant for: Hypertension, hyperlipidemia, impaired glucose tolerance, stage III chronic kidney disease, vitamin D  deficiency, obesity, PTSD and anxiety.  She is needing refills of Wellbutrin  today.  Wonders about increasing Wegovy  dose.  She has been dealing with some chest burning and pain especially at nighttime while lying down flat.  No exertional pain or symptoms.   Past Medical/Surgical History: Past Medical History:  Diagnosis Date   Anxiety    Chronic kidney disease    GAD (generalized anxiety disorder)    Genital HSV    Hyperlipidemia    Hypertension    Kidney disease    Obesity    Obesity (BMI 30.0-34.9)     History reviewed. No pertinent surgical history.  Social History:  reports that she has never smoked. She has never used smokeless tobacco. She reports that she does not currently use alcohol. She reports that she does not currently use drugs.  Allergies: No Known Allergies  Family History:  History reviewed. No pertinent family history.   Current Outpatient Medications:    amLODipine  (NORVASC ) 5 MG tablet, TAKE 1 TABLET(5 MG) BY MOUTH DAILY, Disp: 90 tablet, Rfl: 1   Ascorbic Acid (VITAMIN C) 100 MG tablet, Take 100 mg by mouth daily., Disp: , Rfl:    atorvastatin  (LIPITOR) 20 MG tablet, Take 1 tablet (20 mg total) by mouth at bedtime., Disp: 90 tablet, Rfl: 1   Ferrous Gluconate (FE-40 PO), Take by mouth., Disp: , Rfl:    hydrochlorothiazide  (HYDRODIURIL ) 25 MG tablet, TAKE 1 TABLET(25 MG) BY MOUTH DAILY, Disp: 90 tablet, Rfl: 1   hydrOXYzine  (ATARAX ) 25 MG tablet, Take 0.5 tablets (12.5 mg total) by mouth 3 (three) times daily as needed., Disp: 30 tablet, Rfl: 0   olmesartan  (BENICAR ) 20 MG tablet, TAKE 1 TABLET(20 MG) BY  MOUTH DAILY, Disp: 90 tablet, Rfl: 1   pantoprazole (PROTONIX) 40 MG tablet, Take 1 tablet (40 mg total) by mouth daily., Disp: 90 tablet, Rfl: 1   Semaglutide -Weight Management (WEGOVY ) 1 MG/0.5ML SOAJ, Inject 1 mg into the skin once a week., Disp: 2 mL, Rfl: 0   traMADol  (ULTRAM ) 50 MG tablet, Take 1 tablet (50 mg total) by mouth every 6 (six) hours as needed (pain)., Disp: 12 tablet, Rfl: 0   valACYclovir  (VALTREX ) 500 MG tablet, Take 1 tablet (500 mg total) by mouth daily., Disp: 90 tablet, Rfl: 1   vitamin B-12 (CYANOCOBALAMIN ) 500 MCG tablet, Take 500 mcg by mouth daily., Disp: , Rfl:    buPROPion  (WELLBUTRIN  XL) 150 MG 24 hr tablet, Take 1 tablet (150 mg total) by mouth daily., Disp: 90 tablet, Rfl: 1  Review of Systems:  Negative unless indicated in HPI.   Physical Exam: Vitals:   07/23/23 1334  BP: 110/80  Pulse: 71  Temp: 98.4 F (36.9 C)  TempSrc: Oral  SpO2: 97%  Weight: 218 lb 14.4 oz (99.3 kg)    Body mass index is 33.28 kg/m.   Physical Exam Vitals reviewed.  Constitutional:      Appearance: Normal appearance.  HENT:     Head: Normocephalic and atraumatic.  Eyes:     Conjunctiva/sclera: Conjunctivae normal.  Cardiovascular:     Rate and Rhythm: Normal rate and regular rhythm.  Pulmonary:  Effort: Pulmonary effort is normal.     Breath sounds: Normal breath sounds.  Skin:    General: Skin is warm and dry.  Neurological:     General: No focal deficit present.     Mental Status: She is alert and oriented to person, place, and time.  Psychiatric:        Mood and Affect: Mood normal.        Behavior: Behavior normal.        Thought Content: Thought content normal.        Judgment: Judgment normal.      Impression and Plan:  Primary hypertension  IGT (impaired glucose tolerance) -     Wegovy ; Inject 1 mg into the skin once a week.  Dispense: 2 mL; Refill: 0  Morbid obesity (HCC) -     Wegovy ; Inject 1 mg into the skin once a week.  Dispense: 2  mL; Refill: 0  Gastroesophageal reflux disease, unspecified whether esophagitis present -     Pantoprazole Sodium; Take 1 tablet (40 mg total) by mouth daily.  Dispense: 90 tablet; Refill: 1  GAD (generalized anxiety disorder) -     buPROPion  HCl ER (XL); Take 1 tablet (150 mg total) by mouth daily.  Dispense: 90 tablet; Refill: 1   - Blood pressure is well-controlled on current. - Refill Wellbutrin , continue CBT. - Increased Wegovy  to 1 mg to continue to work on obesity issues. - Suspect chest symptoms related to dyspepsia.  Empiric trial of pantoprazole daily.  Time spent:31 minutes reviewing chart, interviewing and examining patient and formulating plan of care.     Marguerita Shih, MD [facility] at [facility]

## 2023-07-23 NOTE — Telephone Encounter (Signed)
 Pharmacy Patient Advocate Encounter   Received notification from Patient Pharmacy that prior authorization for Wegovy  1 is required/requested.   Insurance verification completed.   The patient is insured through [employer] .   Per test claim: PA required; PA submitted to above mentioned insurance via CoverMyMeds Key/confirmation #/EOC [id number] Status is pending

## 2023-07-23 NOTE — Telephone Encounter (Signed)
 Pharmacy Patient Advocate Encounter  Received notification from [facility] that Prior Authorization for Wegovy  1 has been DENIED.  Full denial letter will be uploaded to the media tab. See denial reason below.   PA #/Case ID/Reference #: [id number]

## 2023-07-24 ENCOUNTER — Other Ambulatory Visit (HOSPITAL_COMMUNITY): Payer: Self-pay

## 2023-07-24 ENCOUNTER — Telehealth: Payer: Self-pay

## 2023-07-24 NOTE — Telephone Encounter (Signed)
 ERROR

## 2023-07-24 NOTE — Telephone Encounter (Signed)
 See telephone note 07/24/23.

## 2023-07-24 NOTE — Telephone Encounter (Signed)
 Pharmacy Patient Advocate Encounter  Received notification from [facility] that Prior Authorization for Wegovy  1  has been APPROVED from 07/24/23 to 01/20/24. Ran test claim, Copay is $24.99 for 28 day supply. This test claim was processed through [facility]- copay amounts may vary at other pharmacies due to pharmacy/plan contracts, or as the patient moves through the different stages of their insurance plan.   PA #/Case ID/Reference #: [id number]

## 2023-07-24 NOTE — Telephone Encounter (Signed)
 Pharmacy Patient Advocate Encounter   Received notification from Physician's Office that prior authorization for Wegovy  is required/requested.   Insurance verification completed.   The patient is insured through [employer] .   Per test claim: PA required; PA submitted to above mentioned insurance via CoverMyMeds Key/confirmation #/EOC [facility] Status is pending

## 2023-07-24 NOTE — Telephone Encounter (Signed)
 Prior Authorization form/request asks a question that requires your assistance. Please see the question below and advise accordingly. The PA will not be submitted until the necessary information is received. I am attempting to resubmit, is there any chart notes regarding patients diet and exercise plan?

## 2023-08-20 ENCOUNTER — Other Ambulatory Visit: Payer: Self-pay | Admitting: Internal Medicine

## 2023-09-30 ENCOUNTER — Other Ambulatory Visit: Payer: Self-pay | Admitting: Internal Medicine

## 2023-09-30 DIAGNOSIS — R7302 Impaired glucose tolerance (oral): Secondary | ICD-10-CM

## 2023-10-24 IMAGING — MG MM DIGITAL SCREENING BILAT W/ TOMO AND CAD
8 series · 8 of 24 positions shown · non-contrast
Comparison: Previous exam(s).

CLINICAL DATA: Screening.

EXAM:
DIGITAL SCREENING BILATERAL MAMMOGRAM WITH TOMOSYNTHESIS AND CAD
TECHNIQUE: Bilateral screening digital craniocaudal and mediolateral oblique
mammograms were obtained. Bilateral screening digital breast
tomosynthesis was performed. The images were evaluated with
computer-aided detection.

[L CC synth-2D]
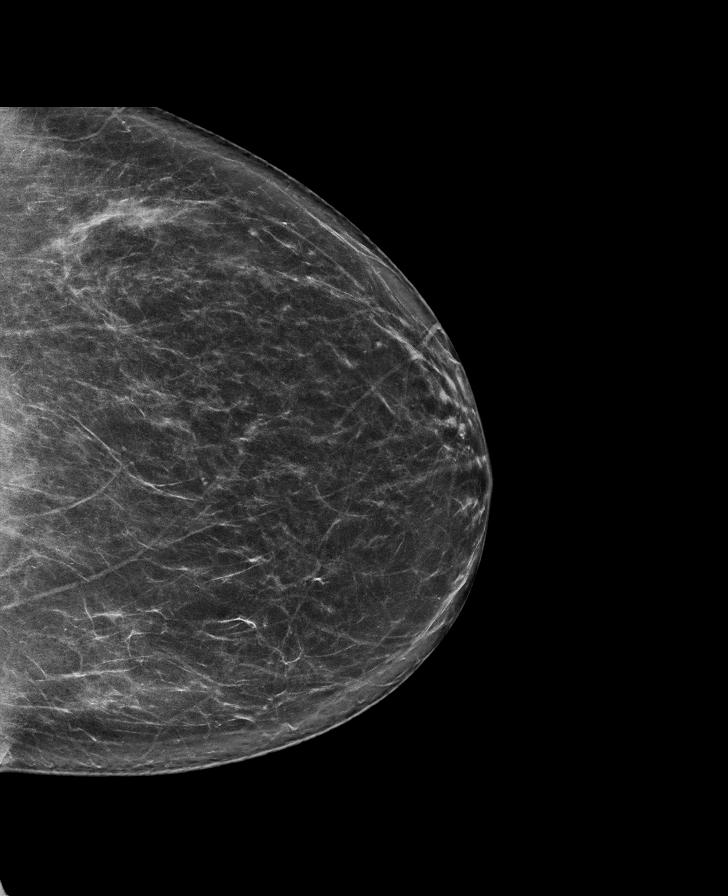

[R MLO synth-2D]
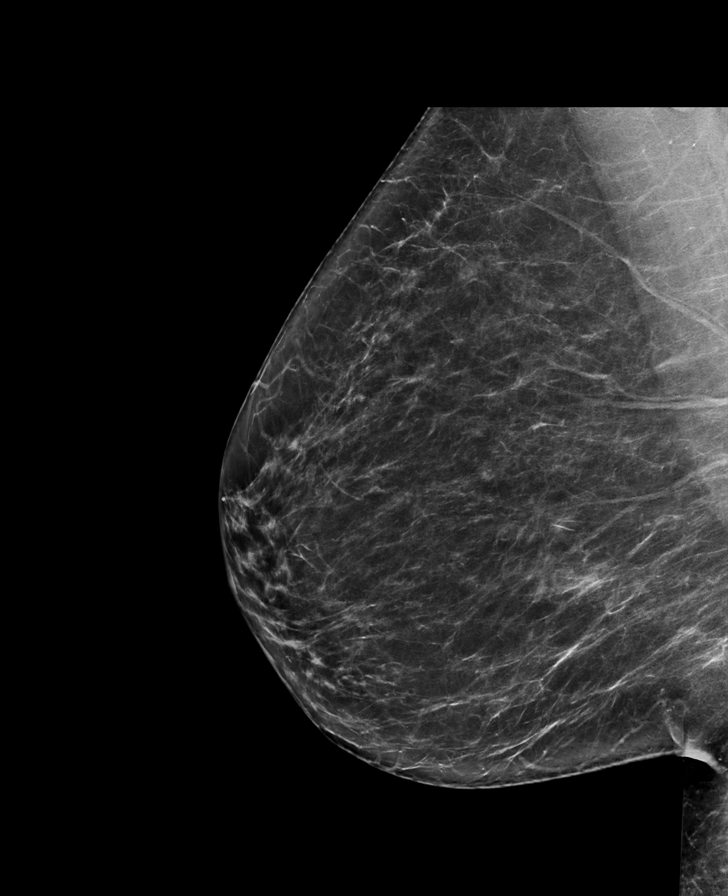

[L MLO synth-2D]
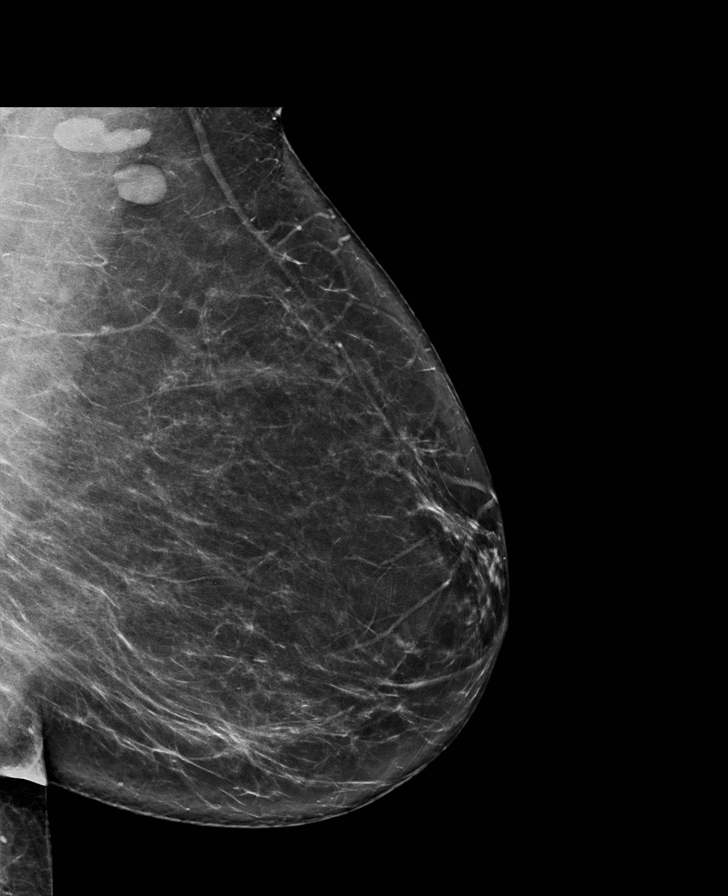

[R CC synth-2D]
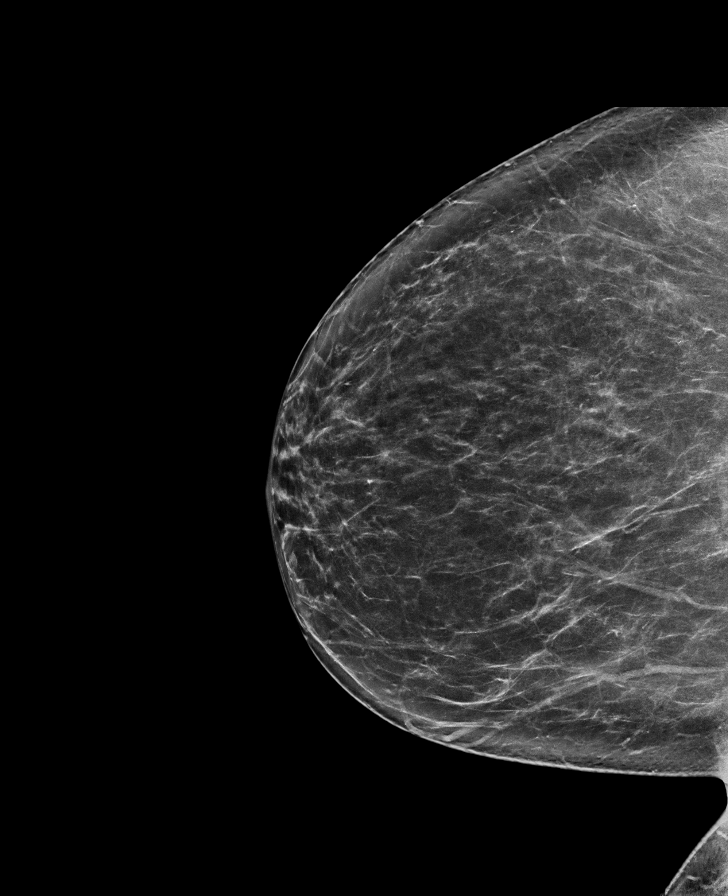

[L MLO tomo · tomo slice 43/85.0]
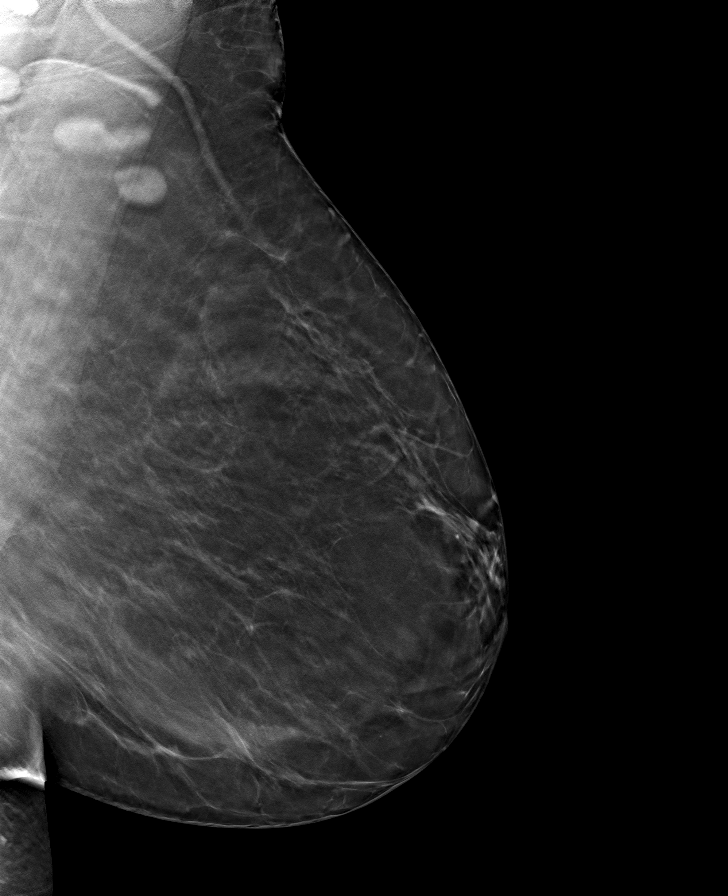

[L CC tomo · tomo slice 39/78.0]
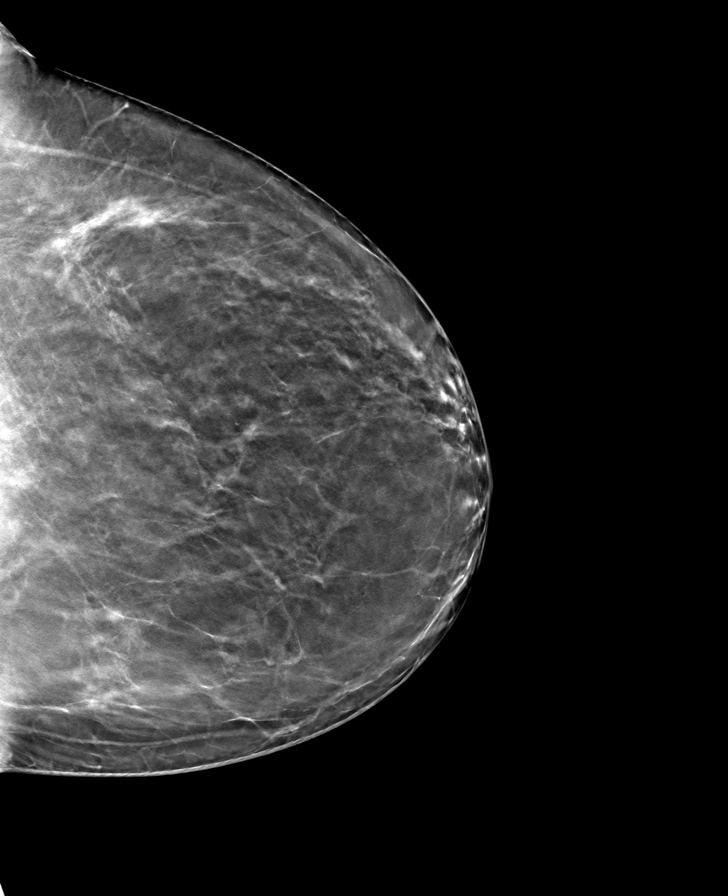

[R MLO tomo · tomo slice 41/80.0]
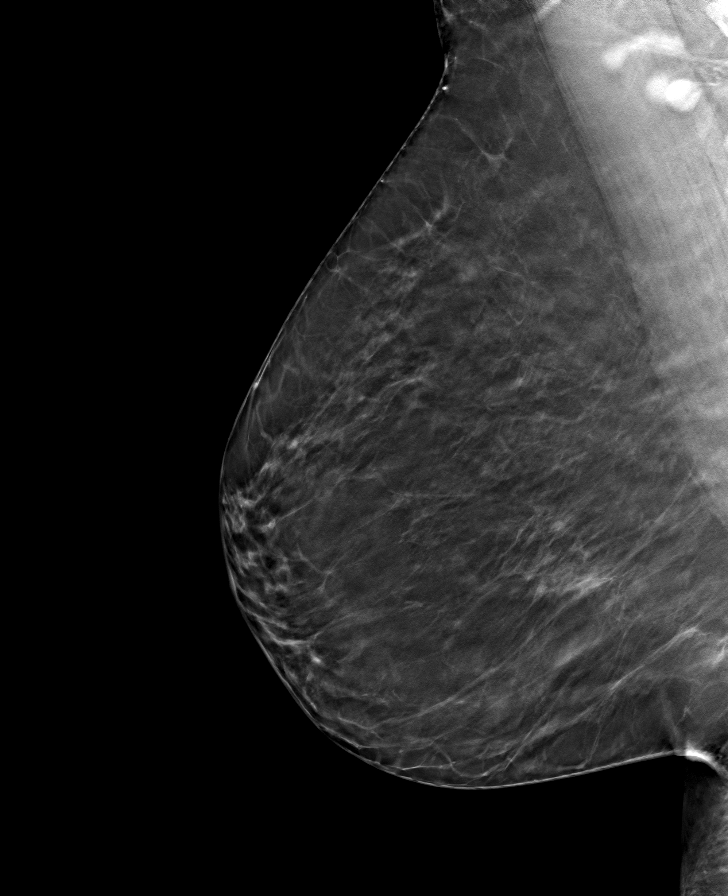

[R CC tomo · tomo slice 39/78.0]
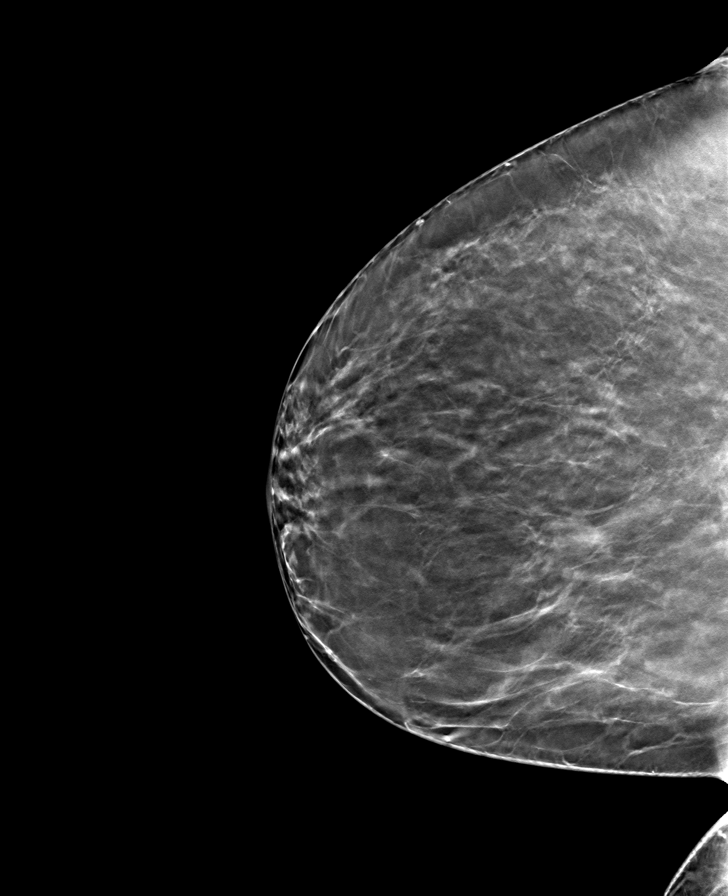

[8 of 24 positions shown; findings below may reference images not displayed]

ACR Breast Density Category b: There are scattered areas of
fibroglandular density.
FINDINGS: There are no findings suspicious for malignancy.
IMPRESSION: No mammographic evidence of malignancy. A result letter of this
screening mammogram will be mailed directly to the patient.

RECOMMENDATION:
Screening mammogram in one year. (Code:51-O-LD2)

BI-RADS CATEGORY  1: Negative.

## 2023-12-20 ENCOUNTER — Ambulatory Visit: Payer: Self-pay

## 2023-12-20 ENCOUNTER — Ambulatory Visit: Admitting: Family Medicine

## 2023-12-20 ENCOUNTER — Encounter: Payer: Self-pay | Admitting: Family Medicine

## 2023-12-20 VITALS — BP 130/82 | HR 75 | Temp 97.7°F | Wt 217.7 lb

## 2023-12-20 DIAGNOSIS — R159 Full incontinence of feces: Secondary | ICD-10-CM

## 2023-12-20 NOTE — Patient Instructions (Signed)
 Consider fiber supplement such as Fibercon, Metamucil, or Citrucel.  Let me know if fecal seepage does not resolve with the above.

## 2023-12-20 NOTE — Telephone Encounter (Signed)
 FYI Only or Action Required?: FYI only for provider.  Patient was last seen in primary care on 07/23/2023 by Theophilus Andrews, Tully GRADE, MD.  Called Nurse Triage reporting Diarrhea.  Symptoms began several days ago.  Interventions attempted: OTC medications: probiotics and Rest, hydration, or home remedies.  Symptoms are: unchanged.  Triage Disposition: See Physician Within 24 Hours  Patient/caregiver understands and will follow disposition?: Yes    Copied from CRM #[id number]. Topic: Clinical - Red Word Triage >> Dec 20, 2023  1:07 PM Shereese L wrote: [facility] that prompted transfer to Nurse Triage: Stomach pains, bloating, bathroom frequency, diahrrea Reason for Disposition  [1] MODERATE diarrhea (e.g., 4-6 times / day more than normal) AND [2] present > 48 hours (2 days)  Answer Assessment - Initial Assessment Questions Additional info:  1) started probiotics to help but doesn't seem to make a difference.  2) Stopped Wygovy one month ago due to indigestion and hearburn, this has resolved.     1. DIARRHEA SEVERITY: How bad is the diarrhea? How many more stools have you had in the past 24 hours than normal?      Increased frequency, mild stool leaking 2. ONSET: When did the diarrhea begin?      2 weeks ago  3. STOOL DESCRIPTION:  How loose or watery is the diarrhea? What is the stool color? Is there any blood or mucous in the stool?     watery 4. VOMITING: Are you also vomiting? If Yes, ask: How many times in the past 24 hours?      denies 5. ABDOMEN PAIN: Are you having any abdomen pain? If Yes, ask: What does it feel like? (e.g., crampy, dull, intermittent, constant)      Cramping  6. ABDOMEN PAIN SEVERITY: If present, ask: How bad is the pain?  (e.g., Scale 1-10; mild, moderate, or severe)     Mild  7. ORAL INTAKE: If vomiting, Have you been able to drink liquids? How much liquids have you had in the past 24 hours?     Not vomiting 8. HYDRATION:  Any signs of dehydration? (e.g., dry mouth [not just dry lips], too weak to stand, dizziness, new weight loss) When did you last urinate?     hydrated 9. EXPOSURE: Have you traveled to a foreign country recently? Have you been exposed to anyone with diarrhea? Could you have eaten any food that was spoiled?     no 10. ANTIBIOTIC USE: Are you taking antibiotics now or have you taken antibiotics in the past 2 months?       no 11. OTHER SYMPTOMS: Do you have any other symptoms? (e.g., fever, blood in stool)       Bloating.  12. PREGNANCY: Is there any chance you are pregnant? When was your last menstrual period?  Protocols used: [facility]

## 2023-12-20 NOTE — Progress Notes (Signed)
   Established Patient Office Visit  Subjective   Patient ID: Rhonda Harrison, female    DOB: August 26, 1965  Age: 58 y.o. MRN: [id number]  Chief Complaint  Patient presents with   Abdominal Pain    HPI   [name] is seen today as a work in with some seepage of mucus rectal area recently sometimes occurring after bowel movements.  She states she is fairly regular with regard to bowel movements having 1 most days of the week.  Does not generally have to do any straining.  Denies any recent diarrhea.  She frequently has a bowel movement and then about 40 minutes later goes back and wipes and notices some residual seepage.  No perianal numbness.  No actual stool incontinence.  She has not noted any blood.  Last colonoscopy was in [place]  about 5 years ago and reportedly normal.  Normal appetite.  No weight change.  No perianal pain.  No abdominal pain.  Past Medical History:  Diagnosis Date   Anxiety    Chronic kidney disease    GAD (generalized anxiety disorder)    Genital HSV    Hyperlipidemia    Hypertension    Kidney disease    Obesity    Obesity (BMI 30.0-34.9)    History reviewed. No pertinent surgical history.  reports that she has never smoked. She has never used smokeless tobacco. She reports that she does not currently use alcohol. She reports that she does not currently use drugs. family history is not on file. No Known Allergies  Review of Systems  Constitutional:  Negative for chills and fever.  Gastrointestinal:  Negative for abdominal pain, blood in stool, diarrhea and melena.      Objective:     BP 130/82   Pulse 75   Temp 97.7 F (36.5 C) (Oral)   Wt 217 lb 11.2 oz (98.7 kg)   SpO2 99%   BMI 33.10 kg/m  BP Readings from Last 3 Encounters:  12/20/23 130/82  07/23/23 110/80  03/14/23 102/74   Wt Readings from Last 3 Encounters:  12/20/23 217 lb 11.2 oz (98.7 kg)  07/23/23 218 lb 14.4 oz (99.3 kg)  03/14/23 229 lb 11.2 oz (104.2 kg)       Physical Exam Vitals reviewed.  Constitutional:      General: She is not in acute distress. Cardiovascular:     Rate and Rhythm: Normal rate and regular rhythm.  Abdominal:     Palpations: Abdomen is soft.     Tenderness: There is no abdominal tenderness.  Neurological:     Mental Status: She is alert.      No results found for any visits on 12/20/23.  Last CBC Lab Results  Component Value Date   WBC 9.0 03/14/2023   HGB 11.2 (L) 03/14/2023   HCT 35.0 (L) 03/14/2023   MCV 85.7 03/14/2023   MCH 26.8 02/01/2023   RDW 13.7 03/14/2023   PLT 326.0 03/14/2023      The 10-year ASCVD risk score (Arnett DK, et al., 2019) is: 4.9%    Assessment & Plan:   Fecal seepage.  Denies any diarrhea.  We discussed this is sometimes related to incomplete evacuation of colon.  We have suggested fiber supplement with FiberCon, Citrucel, or Metamucil and increasing dietary fiber to at least 25 to 30 g/day.  Drink plenty fluids.  If not seeing improvement with the above consider GI referral  Wolm Scarlet, MD

## 2023-12-24 NOTE — Telephone Encounter (Signed)
Pt seen on 10/10.

## 2024-01-09 ENCOUNTER — Other Ambulatory Visit (HOSPITAL_COMMUNITY): Payer: Self-pay

## 2024-01-10 ENCOUNTER — Other Ambulatory Visit (HOSPITAL_COMMUNITY): Payer: Self-pay

## 2024-01-10 ENCOUNTER — Telehealth: Payer: Self-pay

## 2024-01-10 NOTE — Telephone Encounter (Signed)
 Pharmacy Patient Advocate Encounter   Received notification from Onbase that prior authorization for Wegovy  1is required/requested.   Insurance verification completed.   The patient is insured through [employer].   Per test claim: PA required; PA submitted to above mentioned insurance via Latent Key/confirmation #/EOC [id number] Status is pending

## 2024-01-13 ENCOUNTER — Other Ambulatory Visit (HOSPITAL_COMMUNITY): Payer: Self-pay

## 2024-01-13 NOTE — Telephone Encounter (Signed)
 Pharmacy Patient Advocate Encounter  Received notification from [facility] [employer] that Prior Authorization for Wegovy  1 has been APPROVED from 01/10/24 to 07/24/24. Ran test claim, Copay is $24.99. This test claim was processed through [facility]- copay amounts may vary at other pharmacies due to pharmacy/plan contracts, or as the patient moves through the different stages of their insurance plan.   PA #/Case ID/Reference #: # [id number]

## 2024-01-14 NOTE — Telephone Encounter (Signed)
 Message sent via MyChart.

## 2024-03-18 ENCOUNTER — Telehealth: Payer: Self-pay

## 2024-03-18 ENCOUNTER — Other Ambulatory Visit (HOSPITAL_COMMUNITY): Payer: Self-pay

## 2024-03-18 ENCOUNTER — Encounter: Payer: Self-pay | Admitting: Internal Medicine

## 2024-03-18 ENCOUNTER — Ambulatory Visit: Admitting: Internal Medicine

## 2024-03-18 VITALS — BP 120/80 | HR 65 | Temp 98.2°F | Ht 68.0 in | Wt 232.6 lb

## 2024-03-18 DIAGNOSIS — Z1211 Encounter for screening for malignant neoplasm of colon: Secondary | ICD-10-CM

## 2024-03-18 DIAGNOSIS — K219 Gastro-esophageal reflux disease without esophagitis: Secondary | ICD-10-CM | POA: Diagnosis not present

## 2024-03-18 DIAGNOSIS — Z0000 Encounter for general adult medical examination without abnormal findings: Secondary | ICD-10-CM | POA: Diagnosis not present

## 2024-03-18 DIAGNOSIS — Z1159 Encounter for screening for other viral diseases: Secondary | ICD-10-CM

## 2024-03-18 DIAGNOSIS — N1831 Chronic kidney disease, stage 3a: Secondary | ICD-10-CM | POA: Diagnosis not present

## 2024-03-18 DIAGNOSIS — Z23 Encounter for immunization: Secondary | ICD-10-CM | POA: Diagnosis not present

## 2024-03-18 DIAGNOSIS — I10 Essential (primary) hypertension: Secondary | ICD-10-CM

## 2024-03-18 DIAGNOSIS — E559 Vitamin D deficiency, unspecified: Secondary | ICD-10-CM | POA: Diagnosis not present

## 2024-03-18 DIAGNOSIS — Z114 Encounter for screening for human immunodeficiency virus [HIV]: Secondary | ICD-10-CM

## 2024-03-18 DIAGNOSIS — F411 Generalized anxiety disorder: Secondary | ICD-10-CM | POA: Diagnosis not present

## 2024-03-18 DIAGNOSIS — R7302 Impaired glucose tolerance (oral): Secondary | ICD-10-CM | POA: Diagnosis not present

## 2024-03-18 DIAGNOSIS — E782 Mixed hyperlipidemia: Secondary | ICD-10-CM | POA: Diagnosis not present

## 2024-03-18 LAB — LIPID PANEL
Cholesterol: 226 mg/dL — ABNORMAL HIGH (ref 28–200)
HDL: 55.1 mg/dL
LDL Cholesterol: 159 mg/dL — ABNORMAL HIGH (ref 10–99)
NonHDL: 170.57
Total CHOL/HDL Ratio: 4
Triglycerides: 58 mg/dL (ref 10.0–149.0)
VLDL: 11.6 mg/dL (ref 0.0–40.0)

## 2024-03-18 LAB — CBC WITH DIFFERENTIAL/PLATELET
Basophils Absolute: 0.1 K/uL (ref 0.0–0.1)
Basophils Relative: 0.9 % (ref 0.0–3.0)
Eosinophils Absolute: 0.4 K/uL (ref 0.0–0.7)
Eosinophils Relative: 4.4 % (ref 0.0–5.0)
HCT: 35.8 % — ABNORMAL LOW (ref 36.0–46.0)
Hemoglobin: 11.6 g/dL — ABNORMAL LOW (ref 12.0–15.0)
Lymphocytes Relative: 27.5 % (ref 12.0–46.0)
Lymphs Abs: 2.2 K/uL (ref 0.7–4.0)
MCHC: 32.5 g/dL (ref 30.0–36.0)
MCV: 81.8 fl (ref 78.0–100.0)
Monocytes Absolute: 0.6 K/uL (ref 0.1–1.0)
Monocytes Relative: 7.7 % (ref 3.0–12.0)
Neutro Abs: 4.7 K/uL (ref 1.4–7.7)
Neutrophils Relative %: 59.5 % (ref 43.0–77.0)
Platelets: 347 K/uL (ref 150.0–400.0)
RBC: 4.38 Mil/uL (ref 3.87–5.11)
RDW: 13.6 % (ref 11.5–15.5)
WBC: 8 K/uL (ref 4.0–10.5)

## 2024-03-18 LAB — TSH: TSH: 2.59 u[IU]/mL (ref 0.35–5.50)

## 2024-03-18 LAB — COMPREHENSIVE METABOLIC PANEL WITH GFR
ALT: 17 U/L (ref 3–35)
AST: 17 U/L (ref 5–37)
Albumin: 4.4 g/dL (ref 3.5–5.2)
Alkaline Phosphatase: 73 U/L (ref 39–117)
BUN: 19 mg/dL (ref 6–23)
CO2: 28 meq/L (ref 19–32)
Calcium: 9.8 mg/dL (ref 8.4–10.5)
Chloride: 101 meq/L (ref 96–112)
Creatinine, Ser: 0.92 mg/dL (ref 0.40–1.20)
GFR: 69.29 mL/min
Glucose, Bld: 114 mg/dL — ABNORMAL HIGH (ref 70–99)
Potassium: 3.6 meq/L (ref 3.5–5.1)
Sodium: 138 meq/L (ref 135–145)
Total Bilirubin: 0.6 mg/dL (ref 0.2–1.2)
Total Protein: 8.3 g/dL (ref 6.0–8.3)

## 2024-03-18 LAB — VITAMIN B12: Vitamin B-12: 343 pg/mL (ref 211–911)

## 2024-03-18 LAB — HEMOGLOBIN A1C: Hgb A1c MFr Bld: 6.4 % (ref 4.6–6.5)

## 2024-03-18 LAB — VITAMIN D 25 HYDROXY (VIT D DEFICIENCY, FRACTURES): VITD: 20.29 ng/mL — ABNORMAL LOW (ref 30.00–100.00)

## 2024-03-18 MED ORDER — OLMESARTAN MEDOXOMIL 20 MG PO TABS: 10.0000 mg | ORAL_TABLET | Freq: Every day | ORAL | 1 refills | Status: AC

## 2024-03-18 MED ORDER — AMLODIPINE BESYLATE 5 MG PO TABS: 5.0000 mg | ORAL_TABLET | Freq: Every day | ORAL | 1 refills | Status: AC

## 2024-03-18 MED ORDER — ZEPBOUND 2.5 MG/0.5ML ~~LOC~~ SOAJ: 2.5000 mg | SUBCUTANEOUS | 0 refills | Status: AC

## 2024-03-18 MED ORDER — HYDROCHLOROTHIAZIDE 25 MG PO TABS: 25.0000 mg | ORAL_TABLET | Freq: Every day | ORAL | 1 refills | Status: AC

## 2024-03-18 MED ORDER — SERTRALINE HCL 50 MG PO TABS: 50.0000 mg | ORAL_TABLET | Freq: Every day | ORAL | 1 refills | Status: AC

## 2024-03-18 MED ORDER — ATORVASTATIN CALCIUM 20 MG PO TABS: 20.0000 mg | ORAL_TABLET | Freq: Every day | ORAL | 1 refills | Status: AC

## 2024-03-18 NOTE — Telephone Encounter (Signed)
 Pharmacy Patient Advocate Encounter  Received notification from [employer] that Prior Authorization for Zepbound  2.5 has been APPROVED from 03/18/24 to 10/28/24. Ran test claim, Copay is $30.00. This test claim was processed through [facility]- copay amounts may vary at other pharmacies due to pharmacy/plan contracts, or as the patient moves through the different stages of their insurance plan.   PA #/Case ID/Reference #: # [id number]

## 2024-03-18 NOTE — Progress Notes (Signed)
 "    Established Patient Office Visit     CC/Reason for Visit: Annual preventive exam, follow-up chronic conditions  HPI: Rhonda Harrison is a 59 y.o. female who is coming in today for the above mentioned reasons. Past Medical History is significant for: Hypertension, hyperlipidemia, impaired glucose tolerance, stage III chronic kidney disease, vitamin D  deficiency, obesity, anxiety/PTSD.  She is feeling well.  She is now due for mammogram and colonoscopy.  She is due for COVID, flu and pneumonia vaccines.  She has routine eye and dental care.  She would like to start something for her anxiety as it is starting to affect her work and interpersonal relationships.  She stopped taking Wegovy  as she had significant dyspepsia and heartburn, would like to try Zepbound .   Past Medical/Surgical History: Past Medical History:  Diagnosis Date   Anxiety    Chronic kidney disease    GAD (generalized anxiety disorder)    Genital HSV    Hyperlipidemia    Hypertension    Kidney disease    Obesity    Obesity (BMI 30.0-34.9)     History reviewed. No pertinent surgical history.  Social History:  reports that she has never smoked. She has never used smokeless tobacco. She reports that she does not currently use alcohol. She reports that she does not currently use drugs.  Allergies: Allergies[1]  Family History:  History reviewed. No pertinent family history.  Current Medications[2]  Review of Systems:  Negative unless indicated in HPI.   Physical Exam: Vitals:   03/18/24 0713  BP: 120/80  Pulse: 65  Temp: 98.2 F (36.8 C)  TempSrc: Oral  SpO2: 99%  Weight: 232 lb 9.6 oz (105.5 kg)  Height: 5' 8 (1.727 m)    Body mass index is 35.37 kg/m.   Physical Exam Vitals reviewed.  Constitutional:      General: She is not in acute distress.    Appearance: Normal appearance. She is obese. She is not ill-appearing, toxic-appearing or diaphoretic.  HENT:     Head: Normocephalic.      Right Ear: Tympanic membrane, ear canal and external ear normal. There is no impacted cerumen.     Left Ear: Tympanic membrane, ear canal and external ear normal. There is no impacted cerumen.     Nose: Nose normal.     Mouth/Throat:     Mouth: Mucous membranes are moist.     Pharynx: Oropharynx is clear. No oropharyngeal exudate or posterior oropharyngeal erythema.  Eyes:     General: No scleral icterus.       Right eye: No discharge.        Left eye: No discharge.     Conjunctiva/sclera: Conjunctivae normal.     Pupils: Pupils are equal, round, and reactive to light.  Neck:     Vascular: No carotid bruit.  Cardiovascular:     Rate and Rhythm: Normal rate and regular rhythm.     Pulses: Normal pulses.     Heart sounds: Normal heart sounds.  Pulmonary:     Effort: Pulmonary effort is normal. No respiratory distress.     Breath sounds: Normal breath sounds.  Abdominal:     General: Abdomen is flat. Bowel sounds are normal.     Palpations: Abdomen is soft.  Musculoskeletal:        General: Normal range of motion.     Cervical back: Normal range of motion.  Skin:    General: Skin is warm and dry.  Neurological:  General: No focal deficit present.     Mental Status: She is alert and oriented to person, place, and time. Mental status is at baseline.  Psychiatric:        Mood and Affect: Mood normal.        Behavior: Behavior normal.        Thought Content: Thought content normal.        Judgment: Judgment normal.       Impression and Plan:  IGT (impaired glucose tolerance) -     Hemoglobin A1c; Future  Primary hypertension -     CBC with Differential/Platelet; Future -     Comprehensive metabolic panel with GFR; Future -     amLODIPine  Besylate; Take 1 tablet (5 mg total) by mouth daily.  Dispense: 90 tablet; Refill: 1 -     hydroCHLOROthiazide ; Take 1 tablet (25 mg total) by mouth daily.  Dispense: 90 tablet; Refill: 1 -     Olmesartan  Medoxomil; Take 0.5  tablets (10 mg total) by mouth daily.  Dispense: 90 tablet; Refill: 1  Gastroesophageal reflux disease, unspecified whether esophagitis present  Vitamin D  deficiency  Mixed hyperlipidemia -     Lipid panel; Future -     Atorvastatin  Calcium ; Take 1 tablet (20 mg total) by mouth daily.  Dispense: 90 tablet; Refill: 1  Stage 3a chronic kidney disease (HCC)  Screening for malignant neoplasm of colon -     Ambulatory referral to Gastroenterology  Morbid obesity (HCC) -     TSH; Future -     Vitamin B12; Future -     VITAMIN D  25 Hydroxy (Vit-D Deficiency, Fractures); Future -     Zepbound ; Inject 2.5 mg into the skin once a week.  Dispense: 2 mL; Refill: 0  GAD (generalized anxiety disorder) -     Sertraline  HCl; Take 1 tablet (50 mg total) by mouth daily.  Dispense: 90 tablet; Refill: 1  Encounter for hepatitis C screening test for low risk patient -     Hepatitis C antibody; Future  Encounter for screening for HIV -     HIV Antibody (routine testing w rflx); Future  Immunization due  -Recommend routine eye and dental care. -Healthy lifestyle discussed in detail. -Labs to be updated today. -Prostate cancer screening: N/A Health Maintenance  Topic Date Due   HIV Screening  Never done   Hepatitis C Screening  Never done   Pneumococcal Vaccine for age over 32 (1 of 2 - PCV) Never done   Hepatitis B Vaccine (1 of 3 - 19+ 3-dose series) Never done   Colon Cancer Screening  Never done   Flu Shot  10/11/2023   COVID-19 Vaccine (4 - 2025-26 season) 11/11/2023   Breast Cancer Screening  03/20/2025   DTaP/Tdap/Td vaccine (2 - Td or Tdap) 06/27/2025   Pap with HPV screening  08/15/2026   Zoster (Shingles) Vaccine  Completed   HPV Vaccine  Aged Out   Meningitis B Vaccine  Aged Out      - Flu and PCV 20 in office today. - Given severe side effects to Wegovy  we will try Zepbound  instead started 2.5 mg. - Start sertraline  50 mg daily for anxiety. - GI referral  placed.    [name], MD [facility]     [1] No Known Allergies [2]  Current Outpatient Medications:    Ascorbic Acid (VITAMIN C) 100 MG tablet, Take 100 mg by mouth daily., Disp: , Rfl:  Ferrous Gluconate (FE-40 PO), Take by mouth., Disp: , Rfl:    sertraline  (ZOLOFT ) 50 MG tablet, Take 1 tablet (50 mg total) by mouth daily., Disp: 90 tablet, Rfl: 1   tirzepatide  (ZEPBOUND ) 2.5 MG/0.5ML Pen, Inject 2.5 mg into the skin once a week., Disp: 2 mL, Rfl: 0   valACYclovir  (VALTREX ) 500 MG tablet, Take 1 tablet (500 mg total) by mouth daily., Disp: 90 tablet, Rfl: 1   amLODipine  (NORVASC ) 5 MG tablet, Take 1 tablet (5 mg total) by mouth daily., Disp: 90 tablet, Rfl: 1   atorvastatin  (LIPITOR) 20 MG tablet, Take 1 tablet (20 mg total) by mouth daily., Disp: 90 tablet, Rfl: 1   hydrochlorothiazide  (HYDRODIURIL ) 25 MG tablet, Take 1 tablet (25 mg total) by mouth daily., Disp: 90 tablet, Rfl: 1   hydrOXYzine  (ATARAX ) 25 MG tablet, Take 0.5 tablets (12.5 mg total) by mouth 3 (three) times daily as needed. (Patient not taking: Reported on 03/18/2024), Disp: 30 tablet, Rfl: 0   olmesartan  (BENICAR ) 20 MG tablet, Take 0.5 tablets (10 mg total) by mouth daily., Disp: 90 tablet, Rfl: 1   pantoprazole  (PROTONIX ) 40 MG tablet, Take 1 tablet (40 mg total) by mouth daily. (Patient not taking: Reported on 03/18/2024), Disp: 90 tablet, Rfl: 1   vitamin B-12 (CYANOCOBALAMIN ) 500 MCG tablet, Take 500 mcg by mouth daily. (Patient not taking: Reported on 03/18/2024), Disp: , Rfl:   "

## 2024-03-18 NOTE — Addendum Note (Signed)
 Addended by: KATHRYNE MILLMAN B on: 03/18/2024 08:02 AM   Modules accepted: Orders

## 2024-03-18 NOTE — Telephone Encounter (Signed)
 Pharmacy Patient Advocate Encounter   Received notification from [facility] KEY that prior authorization for Zepbound  2.5 is required/requested.   Insurance verification completed.   The patient is insured through [employer].   Per test claim: PA required; PA submitted to above mentioned insurance via Latent Key/confirmation #/EOC [id number] Status is pending

## 2024-03-19 ENCOUNTER — Ambulatory Visit: Payer: Self-pay | Admitting: Internal Medicine

## 2024-03-19 DIAGNOSIS — E559 Vitamin D deficiency, unspecified: Secondary | ICD-10-CM

## 2024-03-19 DIAGNOSIS — E782 Mixed hyperlipidemia: Secondary | ICD-10-CM

## 2024-03-19 LAB — HIV ANTIBODY (ROUTINE TESTING W REFLEX)
HIV 1&2 Ab, 4th Generation: NONREACTIVE
HIV FINAL INTERPRETATION: NEGATIVE

## 2024-03-19 LAB — HEPATITIS C ANTIBODY: Hepatitis C Ab: NONREACTIVE

## 2024-03-19 MED ORDER — VITAMIN D (ERGOCALCIFEROL) 1.25 MG (50000 UNIT) PO CAPS: 50000.0000 [IU] | ORAL_CAPSULE | ORAL | 0 refills | Status: AC

## 2024-03-23 ENCOUNTER — Encounter: Payer: Self-pay | Admitting: Internal Medicine

## 2024-03-24 ENCOUNTER — Telehealth: Payer: Self-pay | Admitting: Internal Medicine

## 2024-03-24 ENCOUNTER — Ambulatory Visit: Payer: Self-pay | Admitting: *Deleted

## 2024-03-24 NOTE — Telephone Encounter (Signed)
 Spoke to the patient and she states that she is feeling better.  Okay to wait for her appointment.

## 2024-03-24 NOTE — Telephone Encounter (Signed)
 FYI Only or Action Required?: FYI only for provider: ED advised and unsure patient will go .  Patient was last seen in primary care on 03/18/2024 by Theophilus Andrews, Tully GRADE, MD.  Called Nurse Triage reporting Chest Pain.  Symptoms began several days ago.  Interventions attempted: Prescription medications: mylanta and prilosec and Rest, hydration, or home remedies.  Symptoms are: unchanged.  Triage Disposition: Go to ED Now (or PCP Triage)  Patient/caregiver understands and will follow disposition?: No, wishes to speak with PCP    Recommended ED due to continued pain taking deep breath in. Patient reports she was seen at [facility] and wants to f/u with PCP. Appt was already scheduled for 03/25/24. Please advise if patient can be seen by PCP today. Patient only wants to see PCP. CAL Margaret notified.          Copied from CRM #[id number]. Topic: Clinical - Red Word Triage >> Mar 24, 2024  8:02 AM Eva FALCON wrote: Red Word that prompted transfer to Nurse Triage: severe stomach pain, chest pain, has been going on since Saturday morning. Reason for Disposition  Taking a deep breath makes pain worse  Patient sounds very sick or weak to the triager  Answer Assessment - Initial Assessment Questions 1. LOCATION: Where does it hurt?       Under ribs left side more abdominal area than chest 2. RADIATION: Does the pain go anywhere else? (e.g., into neck, jaw, arms, back)     Comes from left rib 3. ONSET: When did the chest pain begin? (Minutes, hours or days)      Sunday  4. PATTERN: Does the pain come and go, or has it been constant since it started?  Does it get worse with exertion?      Comes goes worse with exertion  5. DURATION: How long does it last (e.g., seconds, minutes, hours)     Few seconds 6. SEVERITY: How bad is the pain?  (e.g., Scale 1-10; mild, moderate, or severe)     6/10  7. CARDIAC RISK FACTORS: Do you have any history of heart problems or risk factors for  heart disease? (e.g., angina, prior heart attack; diabetes, high blood pressure, high cholesterol, smoker, or strong family history of heart disease)     na 8. PULMONARY RISK FACTORS: Do you have any history of lung disease?  (e.g., blood clots in lung, asthma, emphysema, birth control pills)     na 9. CAUSE: What do you think is causing the chest pain?     Not sure went to UC yesterday and ruled out cardiac issues 10. OTHER SYMPTOMS: Do you have any other symptoms? (e.g., dizziness, nausea, vomiting, sweating, fever, difficulty breathing, cough)       Stomach feels sore, no bloating, but does have burning  sensation. Chest pain area left ribs, with bending radiates to chest. Taking deep breath causes worsening pain or turning a certain way. Denies dizziness no sweating no chest pain in center of chest no difficulty breathing reported no fever reported. 11. PREGNANCY: Is there any chance you are pregnant? When was your last menstrual period?       na  Answer Assessment - Initial Assessment Questions Recommended ED due to pain breathing in. Patient reports she has been to UC and they ruled out cardiac issues and would like to see PCP. Appt already scheduled for 03/25/24. Please advise if patient can be seen today or any other recommendations. CAL notified.    1. LOCATION: Where  does it hurt?      Left upper abdominal rib area 2. RADIATION: Does the pain shoot anywhere else? (e.g., chest, back)     Chest area 3. ONSET: When did the pain begin? (e.g., minutes, hours or days ago)      4 days ago  4. SUDDEN: Gradual or sudden onset?     na 5. PATTERN Does the pain come and go, or is it constant?     Comes and goes esp with movement 6. SEVERITY: How bad is the pain?  (e.g., Scale 1-10; mild, moderate, or severe)     6/10 7. RECURRENT SYMPTOM: Have you ever had this type of stomach pain before? If Yes, ask: When was the last time? and What happened that time?       Last seen at [facility] yesterday  8. AGGRAVATING FACTORS: Does anything seem to cause this pain? (e.g., foods, stress, alcohol)     Not sure  9. CARDIAC SYMPTOMS: Do you have any of the following symptoms: chest pain, difficulty breathing, sweating, nausea?     No  10. OTHER SYMPTOMS: Do you have any other symptoms? (e.g., back pain, diarrhea, fever, urination pain, vomiting)       Left side under ribs, pain breathing in, and with movement. Burning sensation. Taking mylanta and prilosec. Pain better but still noted.  11. PREGNANCY: Is there any chance you are pregnant? When was your last menstrual period?       na  Protocols used: Chest Pain-A-AH, Abdominal Pain - Upper-A-AH

## 2024-03-24 NOTE — Telephone Encounter (Signed)
 Got a call from the triage new she stated pt refuse to go to ER also stated pt have a appt on 03/25/24 and stated she will wait for it.

## 2024-03-24 NOTE — Telephone Encounter (Signed)
 Answered in phone message 03/25/23.

## 2024-03-25 ENCOUNTER — Ambulatory Visit: Admitting: Internal Medicine

## 2024-03-25 ENCOUNTER — Encounter: Payer: Self-pay | Admitting: Internal Medicine

## 2024-03-25 VITALS — BP 110/80 | HR 70 | Temp 98.5°F | Wt 227.8 lb

## 2024-03-25 DIAGNOSIS — K219 Gastro-esophageal reflux disease without esophagitis: Secondary | ICD-10-CM

## 2024-03-25 DIAGNOSIS — Z6834 Body mass index (BMI) 34.0-34.9, adult: Secondary | ICD-10-CM | POA: Diagnosis not present

## 2024-03-25 DIAGNOSIS — Z09 Encounter for follow-up examination after completed treatment for conditions other than malignant neoplasm: Secondary | ICD-10-CM | POA: Diagnosis not present

## 2024-03-25 MED ORDER — PANTOPRAZOLE SODIUM 40 MG PO TBEC: 40.0000 mg | DELAYED_RELEASE_TABLET | Freq: Every day | ORAL | 1 refills | Status: AC

## 2024-03-25 NOTE — Progress Notes (Signed)
 "    Established Patient Office Visit     CC/Reason for Visit: Urgent care follow-up  HPI: Rhonda Harrison is a 59 y.o. female who is coming in today for the above mentioned reasons.  Has a history of GERD.  3 days ago started experiencing pain of her left upper quadrant that she describes as burning that radiated up into the center of her chest.  She took an Advil and immediately had worsening of pain which prompted her to go to the emergency department to rule out cardiac issues.  She had an EKG that was normal.  It was thought to be GERD related and was sent home on Prilosec.  She is requesting referral to dietitian.   Past Medical/Surgical History: Past Medical History:  Diagnosis Date   Anxiety    Chronic kidney disease    GAD (generalized anxiety disorder)    Genital HSV    Hyperlipidemia    Hypertension    Kidney disease    Obesity    Obesity (BMI 30.0-34.9)     History reviewed. No pertinent surgical history.  Social History:  reports that she has never smoked. She has never used smokeless tobacco. She reports that she does not currently use alcohol. She reports that she does not currently use drugs.  Allergies: Allergies[1]  Family History:  History reviewed. No pertinent family history.  Current Medications[2]  Review of Systems:  Negative unless indicated in HPI.   Physical Exam: Vitals:   03/25/24 1607  BP: 110/80  Pulse: 70  Temp: 98.5 F (36.9 C)  TempSrc: Oral  SpO2: 99%  Weight: 227 lb 12.8 oz (103.3 kg)    Body mass index is 34.64 kg/m.    Impression and Plan:  Hospital discharge follow-up  Gastroesophageal reflux disease, unspecified whether esophagitis present -     Pantoprazole  Sodium; Take 1 tablet (40 mg total) by mouth daily.  Dispense: 90 tablet; Refill: 1  Morbid obesity (HCC) -     Amb ref to [facility] charts have been reviewed. - Agree that pain seems likely GERD made worse by NSAID  usage.  Discontinue Prilosec and start pantoprazole  40 mg daily.  If not better can consider GI referral for EGD.  I agree with referral to dietitian in regards to her morbid obesity.  Time spent:31 minutes reviewing chart, interviewing and examining patient and formulating plan of care.     [name], MD [facility]     [1] No Known Allergies [2]  Current Outpatient Medications:    amLODipine  (NORVASC ) 5 MG tablet, Take 1 tablet (5 mg total) by mouth daily., Disp: 90 tablet, Rfl: 1   atorvastatin  (LIPITOR) 20 MG tablet, Take 1 tablet (20 mg total) by mouth daily., Disp: 90 tablet, Rfl: 1   hydrochlorothiazide  (HYDRODIURIL ) 25 MG tablet, Take 1 tablet (25 mg total) by mouth daily., Disp: 90 tablet, Rfl: 1   olmesartan  (BENICAR ) 20 MG tablet, Take 0.5 tablets (10 mg total) by mouth daily., Disp: 90 tablet, Rfl: 1   sertraline  (ZOLOFT ) 50 MG tablet, Take 1 tablet (50 mg total) by mouth daily., Disp: 90 tablet, Rfl: 1   tirzepatide  (ZEPBOUND ) 2.5 MG/0.5ML Pen, Inject 2.5 mg into the skin once a week., Disp: 2 mL, Rfl: 0   Vitamin D , Ergocalciferol , (DRISDOL ) 1.25 MG (50000 UNIT) CAPS capsule, Take 1 capsule (50,000 Units total) by mouth every 7 (seven) days for 12 doses., Disp: 12 capsule, Rfl:  0   Ascorbic Acid (VITAMIN C) 100 MG tablet, Take 100 mg by mouth daily. (Patient not taking: Reported on 03/25/2024), Disp: , Rfl:    Ferrous Gluconate (FE-40 PO), Take by mouth. (Patient not taking: Reported on 03/25/2024), Disp: , Rfl:    hydrOXYzine  (ATARAX ) 25 MG tablet, Take 0.5 tablets (12.5 mg total) by mouth 3 (three) times daily as needed. (Patient not taking: Reported on 03/25/2024), Disp: 30 tablet, Rfl: 0   pantoprazole  (PROTONIX ) 40 MG tablet, Take 1 tablet (40 mg total) by mouth daily., Disp: 90 tablet, Rfl: 1   valACYclovir  (VALTREX ) 500 MG tablet, Take 1 tablet (500 mg total) by mouth daily. (Patient not taking: Reported on 03/25/2024), Disp: 90 tablet,  Rfl: 1   vitamin B-12 (CYANOCOBALAMIN ) 500 MCG tablet, Take 500 mcg by mouth daily. (Patient not taking: Reported on 03/25/2024), Disp: , Rfl:   "

## 2024-03-30 ENCOUNTER — Encounter: Payer: Self-pay | Admitting: Internal Medicine

## 2024-04-22 ENCOUNTER — Encounter: Admitting: Dietician

## 2024-04-24 ENCOUNTER — Ambulatory Visit: Admitting: Internal Medicine
